# Patient Record
Sex: Female | Born: 1950 | Race: Black or African American | Hispanic: No | Marital: Married | Smoking: Former smoker
Health system: Southern US, Community
[De-identification: ages and names within clinical notes are randomized; demographics above are authoritative.]

## PROBLEM LIST (undated history)

## (undated) DIAGNOSIS — D649 Anemia, unspecified: Secondary | ICD-10-CM

## (undated) DIAGNOSIS — R55 Syncope and collapse: Secondary | ICD-10-CM

## (undated) DIAGNOSIS — I472 Ventricular tachycardia, unspecified: Secondary | ICD-10-CM

## (undated) DIAGNOSIS — E782 Mixed hyperlipidemia: Secondary | ICD-10-CM

## (undated) DIAGNOSIS — I428 Other cardiomyopathies: Secondary | ICD-10-CM

## (undated) DIAGNOSIS — I340 Nonrheumatic mitral (valve) insufficiency: Secondary | ICD-10-CM

## (undated) DIAGNOSIS — I10 Essential (primary) hypertension: Secondary | ICD-10-CM

## (undated) HISTORY — DX: Mixed hyperlipidemia: E78.2

## (undated) HISTORY — DX: Ventricular tachycardia, unspecified: I47.20

## (undated) HISTORY — DX: Nonrheumatic mitral (valve) insufficiency: I34.0

## (undated) HISTORY — DX: Other cardiomyopathies: I42.8

## (undated) HISTORY — DX: Anemia, unspecified: D64.9

## (undated) HISTORY — DX: Essential (primary) hypertension: I10

## (undated) HISTORY — PX: BILATERAL SALPINGOOPHORECTOMY: SHX1223

## (undated) HISTORY — DX: Ventricular tachycardia: I47.2

## (undated) HISTORY — DX: Syncope and collapse: R55

## (undated) HISTORY — PX: TOTAL ABDOMINAL HYSTERECTOMY: SHX209

---

## 2006-06-30 ENCOUNTER — Ambulatory Visit: Payer: Self-pay | Admitting: Cardiology

## 2006-06-30 ENCOUNTER — Inpatient Hospital Stay (HOSPITAL_COMMUNITY): Admission: AD | Admit: 2006-06-30 | Discharge: 2006-07-04 | Payer: Self-pay | Admitting: Cardiology

## 2006-07-21 ENCOUNTER — Ambulatory Visit: Payer: Self-pay

## 2006-07-25 ENCOUNTER — Ambulatory Visit: Payer: Self-pay | Admitting: Cardiology

## 2006-10-21 ENCOUNTER — Ambulatory Visit: Payer: Self-pay | Admitting: Internal Medicine

## 2007-02-04 ENCOUNTER — Ambulatory Visit: Payer: Self-pay | Admitting: Internal Medicine

## 2007-05-06 ENCOUNTER — Ambulatory Visit: Payer: Self-pay | Admitting: Internal Medicine

## 2007-05-21 ENCOUNTER — Encounter: Payer: Self-pay | Admitting: Internal Medicine

## 2007-06-01 ENCOUNTER — Encounter: Payer: Self-pay | Admitting: Physician Assistant

## 2007-06-01 ENCOUNTER — Ambulatory Visit: Payer: Self-pay | Admitting: Cardiology

## 2007-06-10 ENCOUNTER — Ambulatory Visit: Payer: Self-pay | Admitting: Cardiology

## 2007-08-06 ENCOUNTER — Ambulatory Visit: Payer: Self-pay | Admitting: Cardiology

## 2007-10-30 ENCOUNTER — Ambulatory Visit: Payer: Self-pay | Admitting: Internal Medicine

## 2008-01-29 ENCOUNTER — Ambulatory Visit: Payer: Self-pay | Admitting: Internal Medicine

## 2008-05-27 ENCOUNTER — Ambulatory Visit: Payer: Self-pay | Admitting: Internal Medicine

## 2008-09-09 ENCOUNTER — Ambulatory Visit: Payer: Self-pay | Admitting: Internal Medicine

## 2008-12-13 ENCOUNTER — Encounter: Payer: Self-pay | Admitting: Internal Medicine

## 2009-02-09 HISTORY — PX: CARDIAC DEFIBRILLATOR PLACEMENT: SHX171

## 2009-03-06 ENCOUNTER — Ambulatory Visit: Payer: Self-pay | Admitting: Cardiology

## 2009-03-07 ENCOUNTER — Encounter: Payer: Self-pay | Admitting: Internal Medicine

## 2009-03-07 ENCOUNTER — Inpatient Hospital Stay (HOSPITAL_COMMUNITY): Admission: EM | Admit: 2009-03-07 | Discharge: 2009-03-09 | Payer: Self-pay | Admitting: Emergency Medicine

## 2009-03-09 ENCOUNTER — Encounter: Payer: Self-pay | Admitting: Internal Medicine

## 2009-03-14 ENCOUNTER — Encounter (INDEPENDENT_AMBULATORY_CARE_PROVIDER_SITE_OTHER): Payer: Self-pay | Admitting: *Deleted

## 2009-03-14 ENCOUNTER — Telehealth (INDEPENDENT_AMBULATORY_CARE_PROVIDER_SITE_OTHER): Payer: Self-pay | Admitting: *Deleted

## 2009-03-21 ENCOUNTER — Encounter: Payer: Self-pay | Admitting: Internal Medicine

## 2009-03-23 ENCOUNTER — Ambulatory Visit: Payer: Self-pay

## 2009-03-23 ENCOUNTER — Telehealth (INDEPENDENT_AMBULATORY_CARE_PROVIDER_SITE_OTHER): Payer: Self-pay | Admitting: *Deleted

## 2009-03-29 DIAGNOSIS — R55 Syncope and collapse: Secondary | ICD-10-CM

## 2009-03-29 DIAGNOSIS — E785 Hyperlipidemia, unspecified: Secondary | ICD-10-CM

## 2009-03-29 DIAGNOSIS — Z95810 Presence of automatic (implantable) cardiac defibrillator: Secondary | ICD-10-CM | POA: Insufficient documentation

## 2009-04-05 DIAGNOSIS — I472 Ventricular tachycardia: Secondary | ICD-10-CM

## 2009-04-05 DIAGNOSIS — I421 Obstructive hypertrophic cardiomyopathy: Secondary | ICD-10-CM | POA: Insufficient documentation

## 2009-04-05 DIAGNOSIS — I10 Essential (primary) hypertension: Secondary | ICD-10-CM | POA: Insufficient documentation

## 2009-04-05 DIAGNOSIS — I080 Rheumatic disorders of both mitral and aortic valves: Secondary | ICD-10-CM

## 2009-04-05 DIAGNOSIS — D649 Anemia, unspecified: Secondary | ICD-10-CM

## 2009-07-07 ENCOUNTER — Ambulatory Visit: Payer: Self-pay | Admitting: Cardiology

## 2009-10-20 ENCOUNTER — Ambulatory Visit: Payer: Self-pay | Admitting: Cardiology

## 2010-02-02 ENCOUNTER — Ambulatory Visit: Payer: Self-pay | Admitting: Internal Medicine

## 2010-04-20 ENCOUNTER — Ambulatory Visit: Payer: Self-pay | Admitting: Cardiology

## 2010-05-10 ENCOUNTER — Encounter: Payer: Self-pay | Admitting: Physician Assistant

## 2010-05-10 ENCOUNTER — Telehealth (INDEPENDENT_AMBULATORY_CARE_PROVIDER_SITE_OTHER): Payer: Self-pay | Admitting: *Deleted

## 2010-08-03 ENCOUNTER — Ambulatory Visit: Payer: Self-pay | Admitting: Internal Medicine

## 2010-10-23 ENCOUNTER — Ambulatory Visit: Payer: Self-pay | Admitting: Internal Medicine

## 2010-10-31 ENCOUNTER — Ambulatory Visit: Payer: Self-pay | Admitting: Internal Medicine

## 2010-11-07 ENCOUNTER — Ambulatory Visit: Payer: Self-pay | Admitting: Cardiology

## 2010-11-13 IMAGING — CR DG CHEST 2V
2 series · 2 of 2 positions shown · non-contrast
Comparison: 07/04/2006

CLINICAL DATA: Defibrillator malfunction.

CHEST - 2 VIEW

[w chest pa]
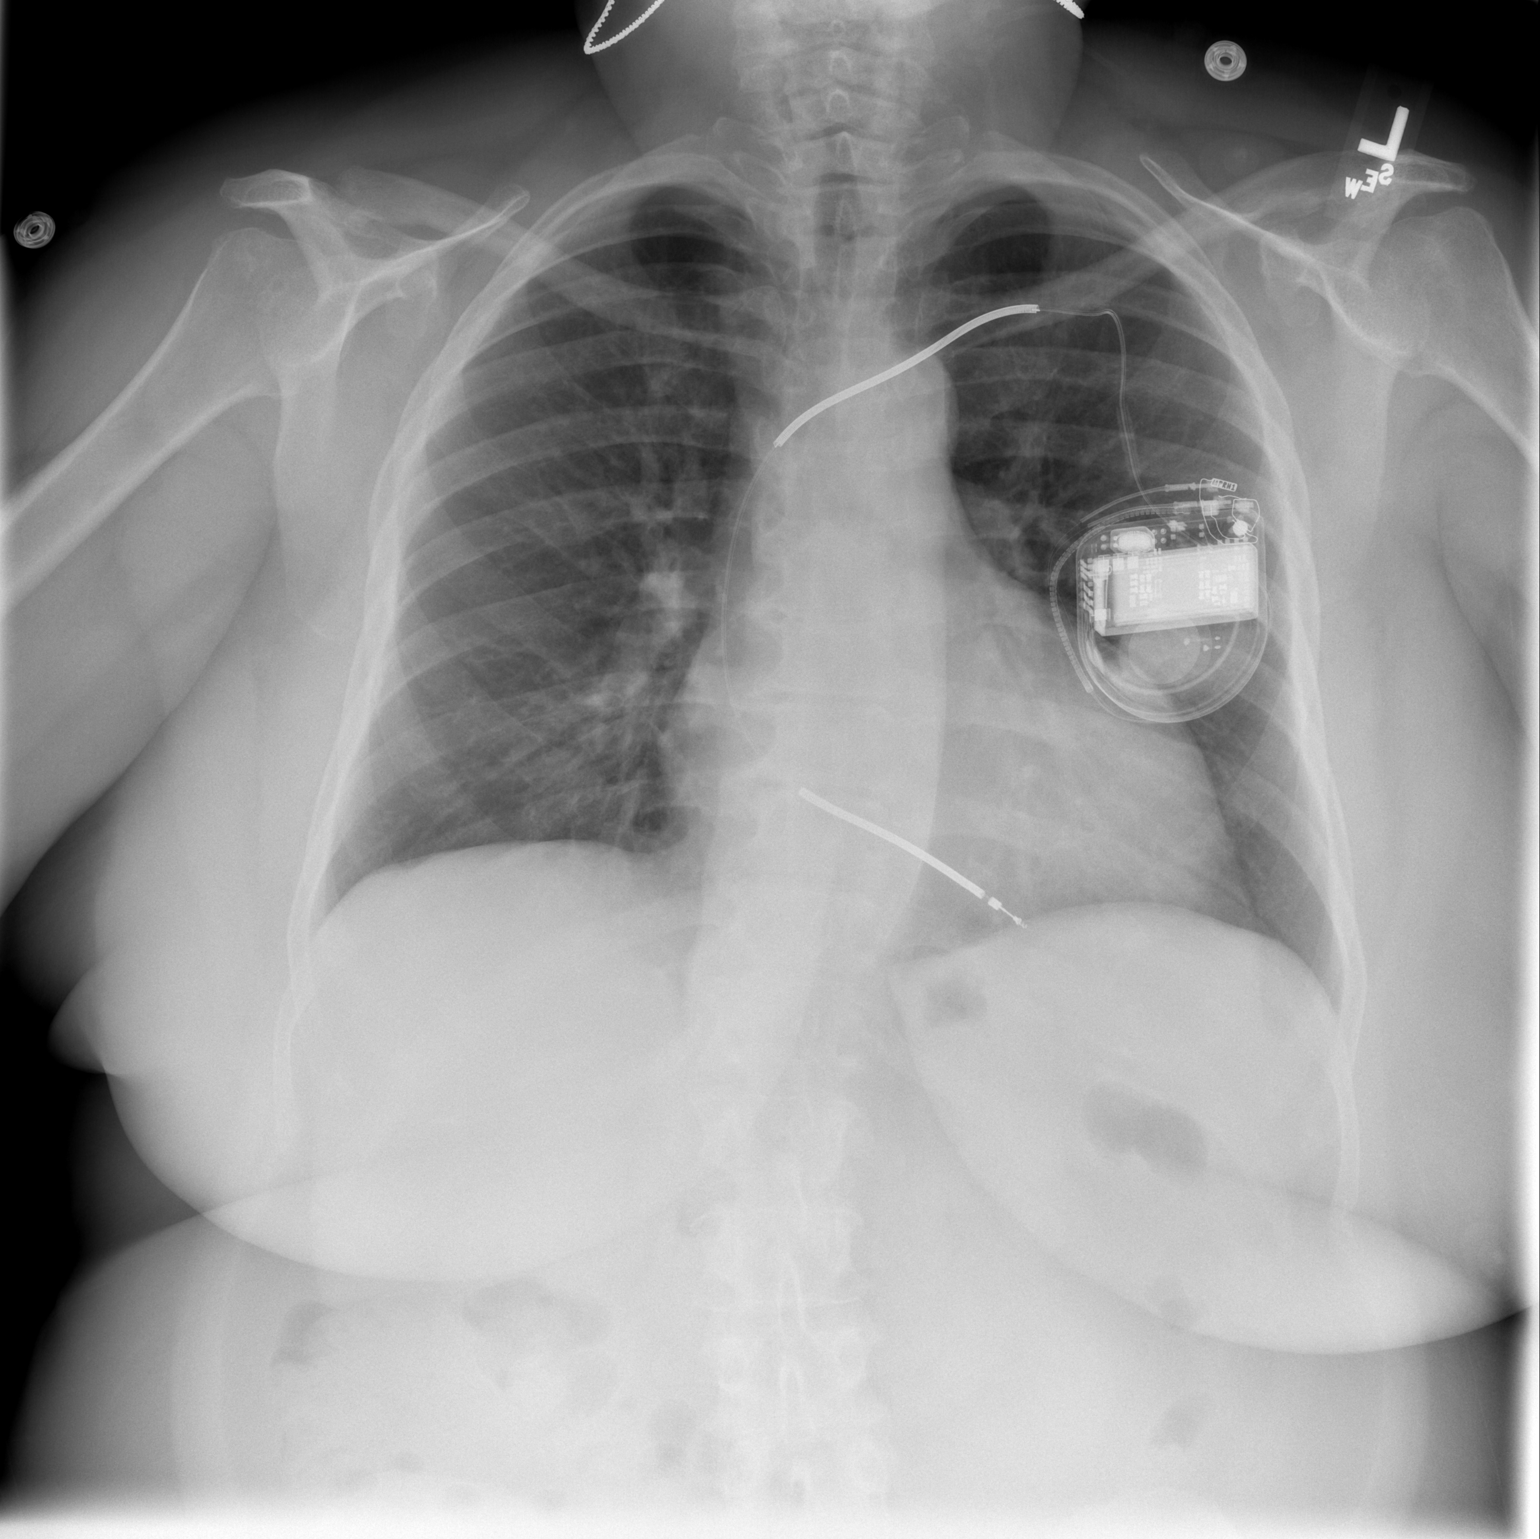

[w chest lat]
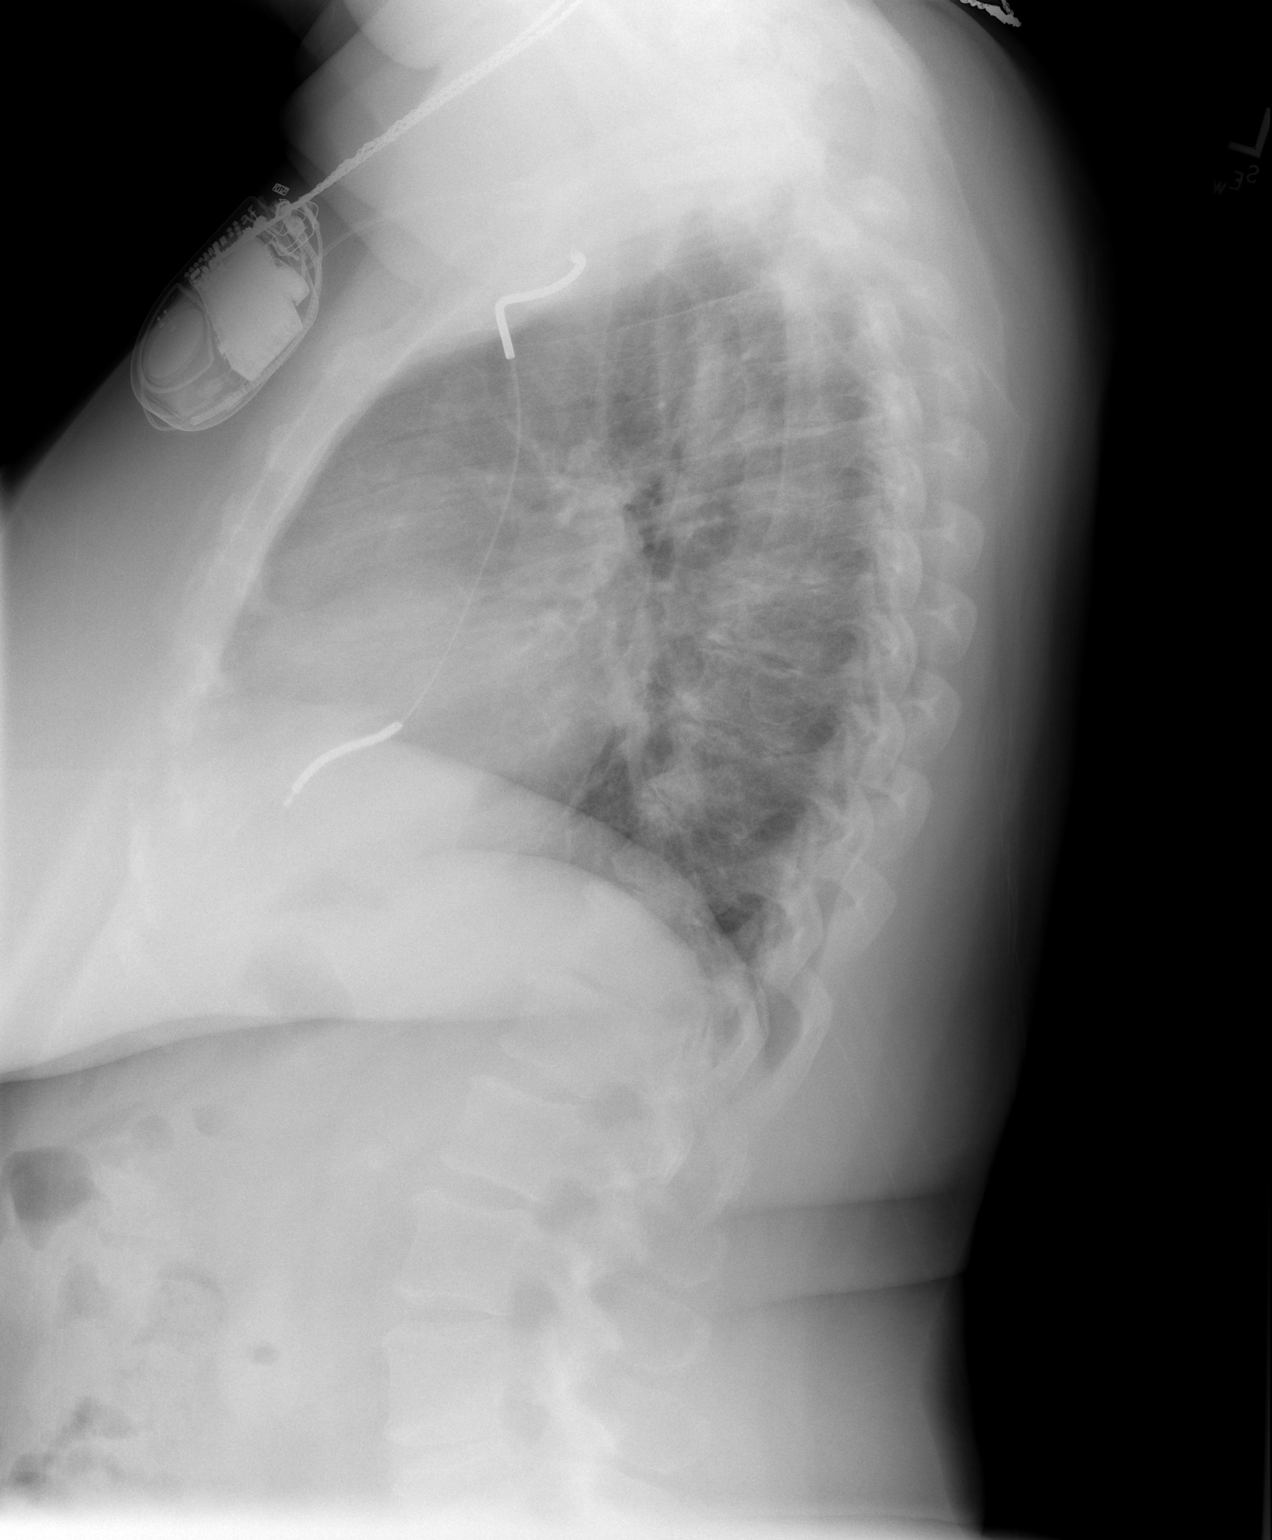

[2 of 2 positions shown; findings below may reference images not displayed]

FINDINGS: Left AICD remains in place, unchanged.  Visualized wires
appear intact.

There is mild cardiomegaly.  No focal airspace opacities or
effusions.  No acute bony abnormality.
IMPRESSION: No acute findings.

## 2010-11-15 IMAGING — CR DG CHEST 2V
2 series · 2 of 2 positions shown · non-contrast
Comparison: Chest radiograph 03/07/2009

CLINICAL DATA: Rewiring pacemaker

CHEST - 2 VIEW

[w chest pa]
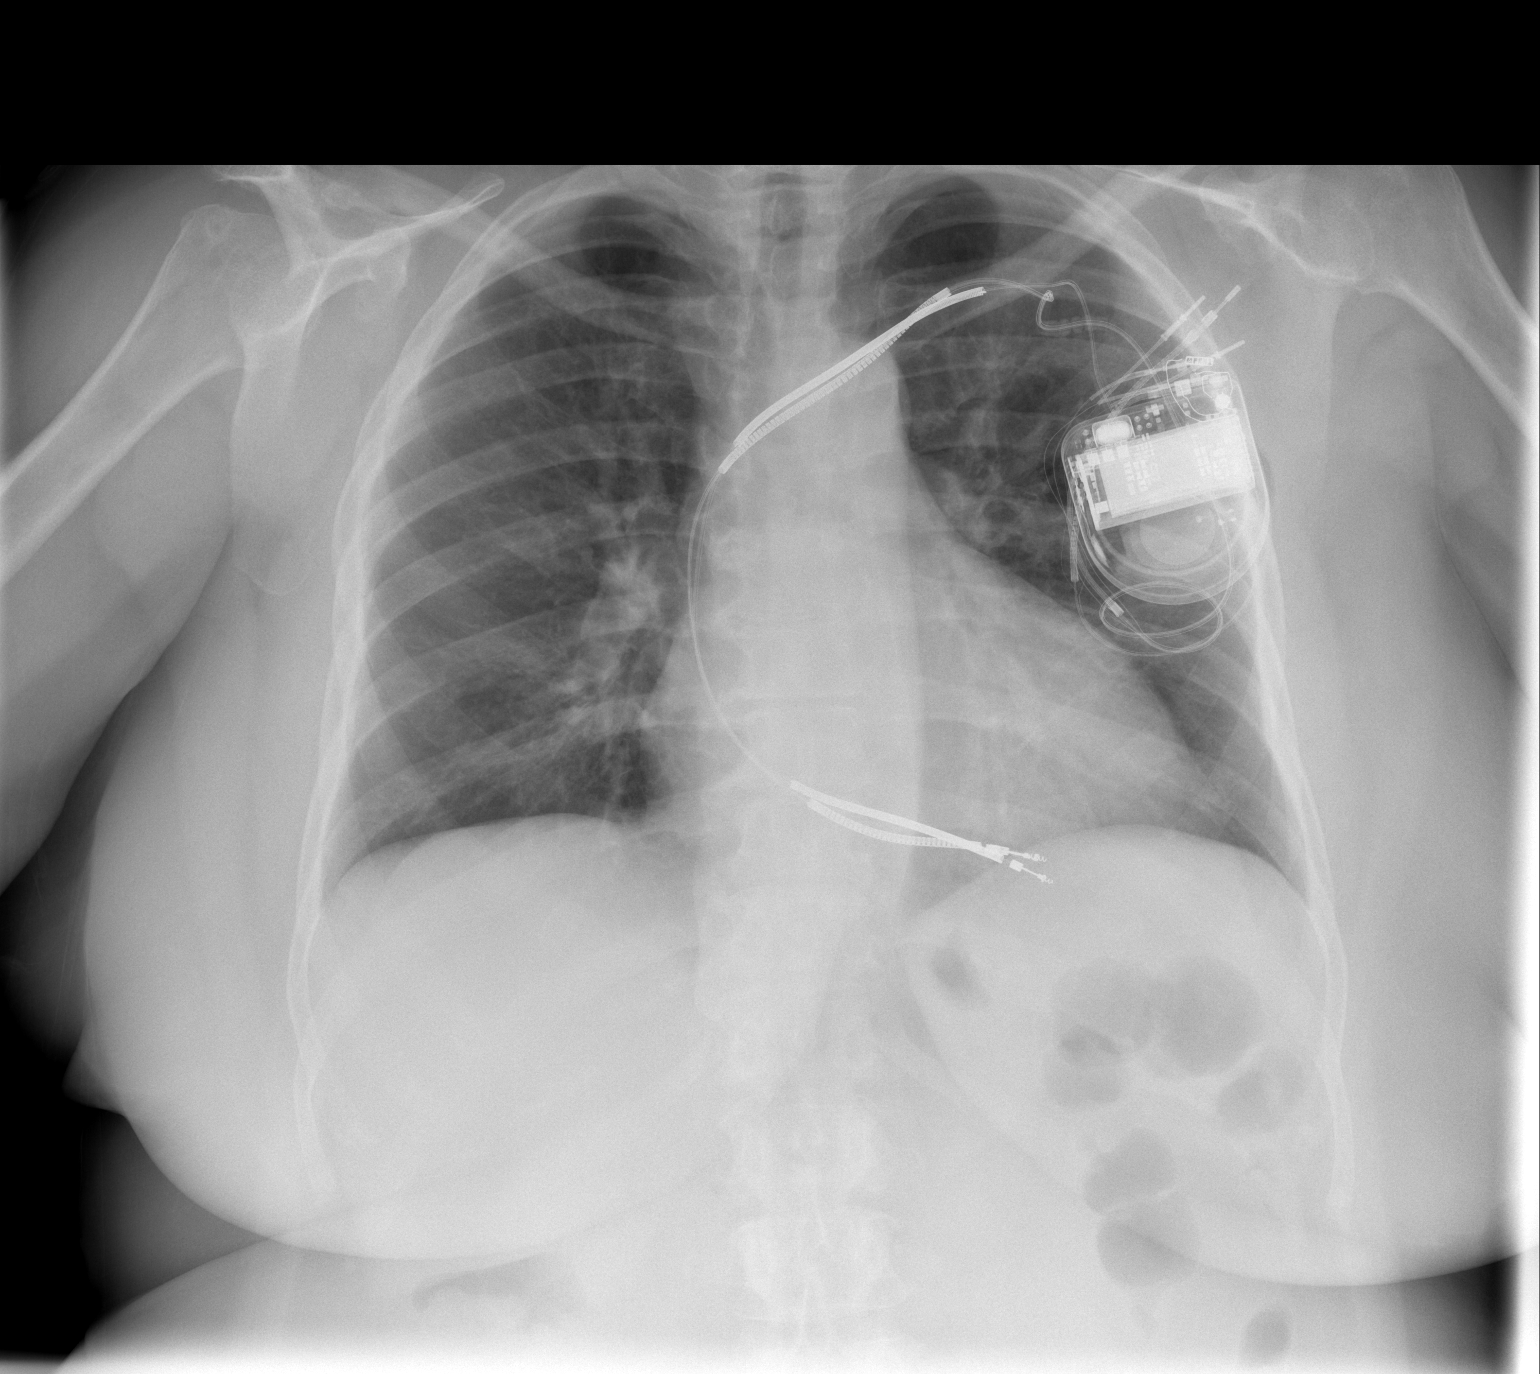

[w chest lat]
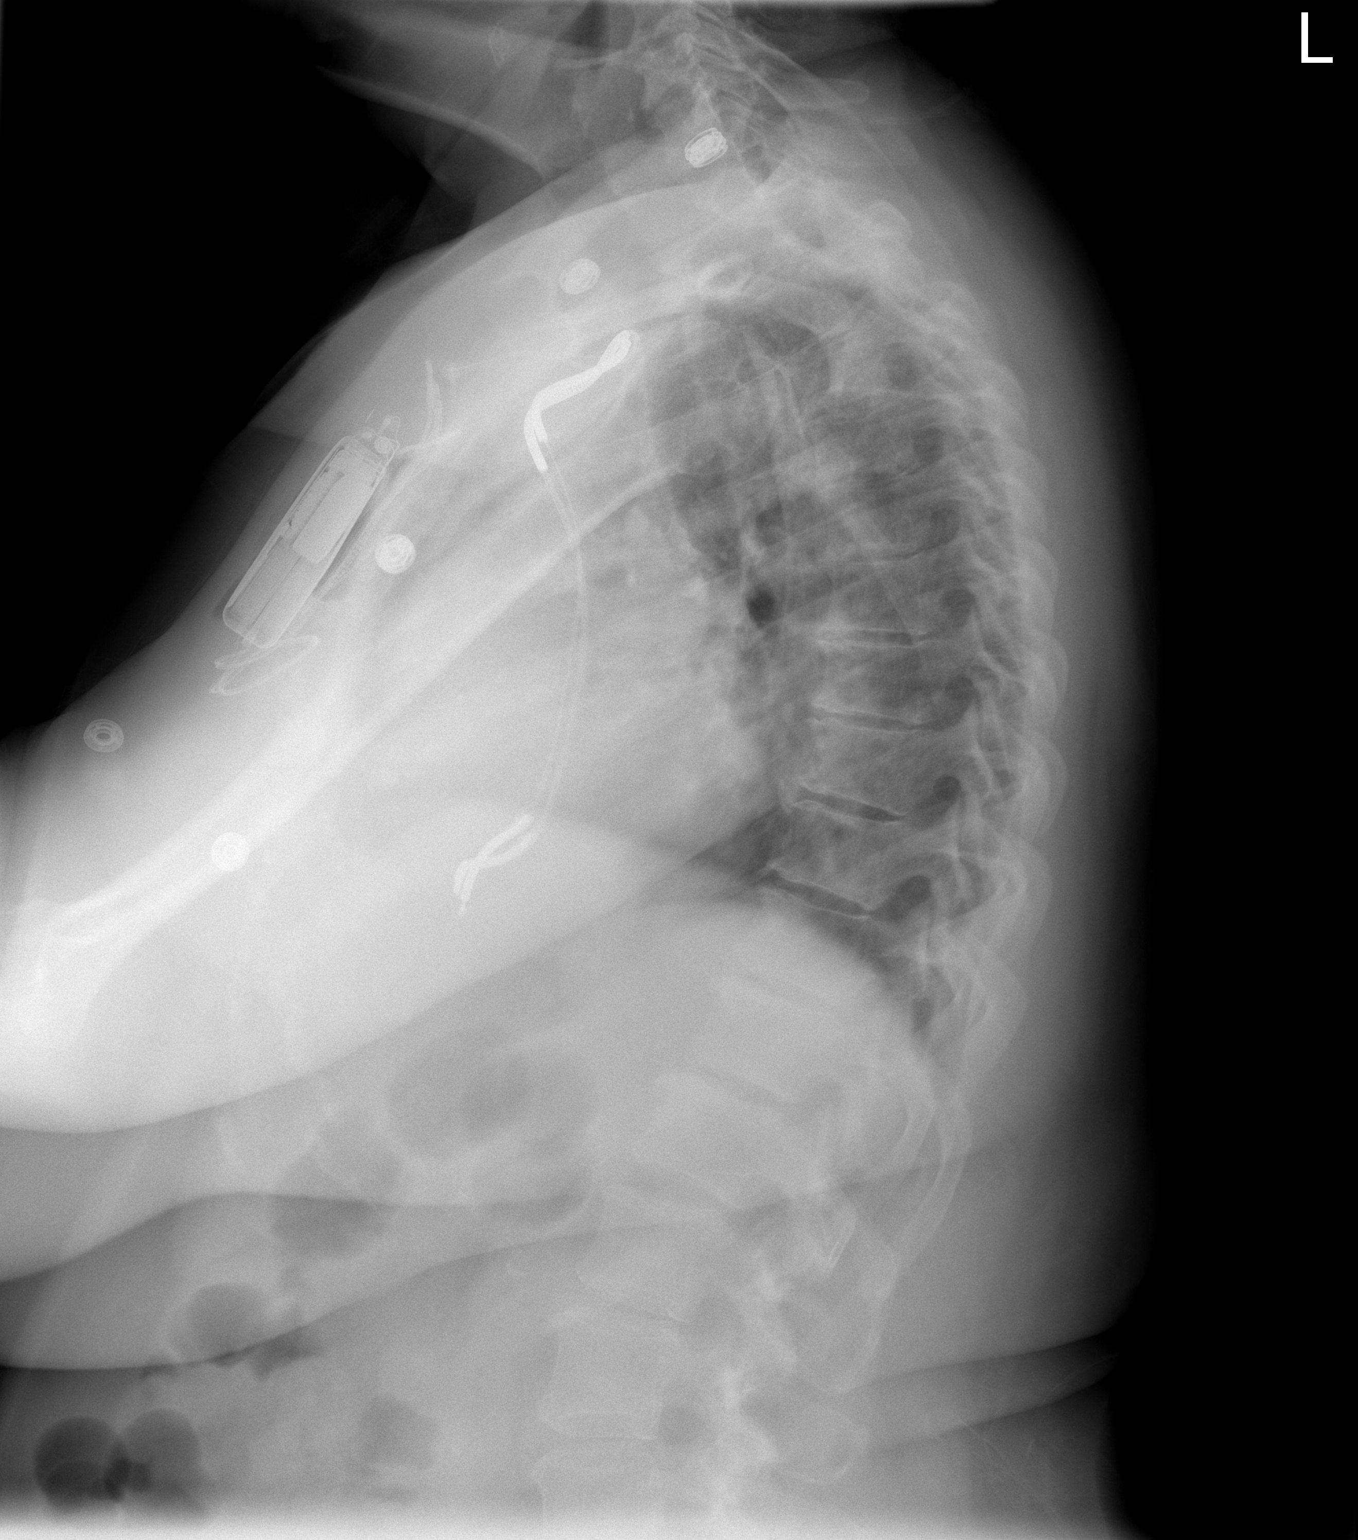

[2 of 2 positions shown; findings below may reference images not displayed]

FINDINGS: Left-sided AICD in place.  Interval placement of new
venous defibrillator wire with tip in the right heart.  No evidence
pneumothorax. Normal cardiac silhouette.
IMPRESSION: No evidence of complication following AICD rewiring.

## 2010-12-11 NOTE — Progress Notes (Signed)
Summary: AVAPRO CHANGED TO LOSARTAN DUE TO REQUESTED PA  ---- Converted from flag ---- ---- 05/10/2010 9:19 AM, Gypsy Balsam RN BSN wrote: Per Dr Johney Frame, change to Losartan 100mg  1 tablet daily.   ---- 05/04/2010 4:14 PM, Carlye Grippe wrote: Elvina Sidle, did you tell me that someone at the [facility] office would handle to Prior Auth for this patient's avapro? we got the request  again today. Can I send this back to [facility] office? ------------------------------  Phone Note Outgoing Call   Call placed by: Carlye Grippe,  May 10, 2010 3:19 PM Call placed to: Patient Summary of Call: left message on machine to call office.  Initial call taken by: Carlye Grippe,  May 10, 2010 3:20 PM  Follow-up for Phone Call        Patient informed of the above.  Follow-up by: Carlye Grippe,  May 10, 2010 4:57 PM    New/Updated Medications: LOSARTAN POTASSIUM 100 MG TABS (LOSARTAN POTASSIUM) Take 1 tablet by mouth once a day Prescriptions: LOSARTAN POTASSIUM 100 MG TABS (LOSARTAN POTASSIUM) Take 1 tablet by mouth once a day  #30 x 2   Entered by:   Carlye Grippe   Authorized by:   Hillis Range, MD   Signed by:   Carlye Grippe on 05/10/2010   Method used:   Electronically to        [facility]* (retail)       [address]       [place], [place]  [address]       Ph: [phone]       Fax: [phone]   RxID:   [id number]

## 2010-12-11 NOTE — Medication Information (Signed)
Summary: Environmental health practitioner [facility]  RX Folder/ [facility]   Imported By: Dorise Hiss 05/10/2010 16:54:38  _____________________________________________________________________  External Attachment:    Type:   Image     Comment:   External Document

## 2010-12-11 NOTE — Assessment & Plan Note (Signed)
Summary: PACER CHECK   Current Medications (verified): 1)  Losartan Potassium 100 Mg Tabs (Losartan Potassium) .... Take 1 Tablet By Mouth Once A Day 2)  Aspirin 81 Mg Tbec (Aspirin) .... One By Mouth Daily 3)  Lipitor 20 Mg Tabs (Atorvastatin Calcium) .... One By Mouth Daily 4)  Coreg 25 Mg Tabs (Carvedilol) .... One By Mouth Bid 5)  Klor-Con M20 20 Meq Cr-Tabs (Potassium Chloride Crys Cr) .... One By Mouth Daily 6)  Hydrochlorothiazide 25 Mg Tabs (Hydrochlorothiazide) .... One By Mouth Daily  Allergies (verified): No Known Drug Allergies    ICD Specifications Following MD:  Hillis Range, MD     ICD Vendor:  Medtronic     ICD Model Number:  7232     ICD Serial Number:  [id number] ICD DOI:  07/03/2006     ICD Implanting MD:  Sherryl Manges, MD  Lead 1:    Location: RV     DOI: 07/03/2006     Model #: 3086     Serial #: [id number]     Status: capped Lead 2:    Location: RV     DOI: /03/08/2009     Model #: [id number]     Serial #: [id number]     Status: active  Indications::  HOCM WITH SYNCOPE   ICD Follow Up Battery Voltage:  3.03 V     Charge Time:  8.25 seconds     Underlying rhythm:  SR ICD Dependent:  No       ICD Device Measurements Right Ventricle:  Amplitude: 10.4 mV, Impedance: 440 ohms, Threshold: 1.0 V at 0.2 msec Shock Impedance: 46/55 ohms   Episodes MS Episodes:  0     Coumadin:  No Shock:  0     ATP:  0     Nonsustained:  0     Atrial Therapies:  0 Ventricular Pacing:  <0.1%  Brady Parameters Mode VVI     Lower Rate Limit:  40      Tachy Zones VF:  200     VT:  250 (FVT VIA VF)     VT1:  171     Next Cardiology Appt Due:  11/19/2010 Tech Comments:  NORMAL DEVICE FUNCTION. NO EPISODES SINCE LAST CHECK. NO CHANGES MADE. ROV 11-19-09 W/JA. [name]  August 03, 2010 11:16 AM

## 2010-12-11 NOTE — Procedures (Signed)
Summary: defib check.[email or web address].[email or web address]   Current Medications (verified): 1)  Avapro 300 Mg Tabs (Irbesartan) .... One By Mouth Daily 2)  Aspirin 81 Mg Tbec (Aspirin) .... One By Mouth Daily 3)  Lipitor 20 Mg Tabs (Atorvastatin Calcium) .... One By Mouth Daily 4)  Coreg 25 Mg Tabs (Carvedilol) .... One By Mouth Bid 5)  Klor-Con M20 20 Meq Cr-Tabs (Potassium Chloride Crys Cr) .... One By Mouth Daily 6)  Hydrochlorothiazide 25 Mg Tabs (Hydrochlorothiazide) .... One By Mouth Daily  Allergies (verified): No Known Drug Allergies   ICD Specifications Following MD:  Hillis Range, MD     ICD Vendor:  Medtronic     ICD Model Number:  7232     ICD Serial Number:  [id number] H ICD DOI:  07/03/2006     ICD Implanting MD:  Sherryl Manges, MD  Lead 1:    Location: RV     DOI: 07/03/2006     Model #: 4098     Serial #: [id number]     Status: capped Lead 2:    Location: RV     DOI: /03/08/2009     Model #: 5621     Serial #: [id number]     Status: active  Indications::  HOCM WITH SYNCOPE   ICD Follow Up Remote Check?  No Battery Voltage:  3.07 V     Charge Time:  8.21 seconds     Underlying rhythm:  SR ICD Dependent:  No       ICD Device Measurements Right Ventricle:  Amplitude: 10.2 mV, Impedance: 448 ohms, Threshold: 1.0 V at 0.2 msec Shock Impedance: 45/49 ohms   Episodes Coumadin:  No Shock:  0     ATP:  0     Nonsustained:  0     Ventricular Pacing:  <0.1%  Brady Parameters Mode VVI     Lower Rate Limit:  40      Tachy Zones VF:  200     VT:  250 (FVT VIA VF)     VT1:  171     Next Cardiology Appt Due:  07/12/2010 Tech Comments:  No parameter changes.  Device function normal.  ROV 3 months [facility]. Altha Harm, LPN  April 20, 2010 10:13 AM

## 2010-12-11 NOTE — Procedures (Signed)
Summary: 3 MO FU W/Marley Pakula-SRS   Current Medications (verified): 1)  Avapro 300 Mg Tabs (Irbesartan) .... One By Mouth Daily 2)  Aspirin 81 Mg Tbec (Aspirin) .... One By Mouth Daily 3)  Lipitor 20 Mg Tabs (Atorvastatin Calcium) .... One By Mouth Daily 4)  Coreg 25 Mg Tabs (Carvedilol) .... One By Mouth Bid 5)  Klor-Con M20 20 Meq Cr-Tabs (Potassium Chloride Crys Cr) .... One By Mouth Daily 6)  Hydrochlorothiazide 25 Mg Tabs (Hydrochlorothiazide) .... One By Mouth Daily  Allergies (verified): No Known Drug Allergies   ICD Specifications Following MD:  Hillis Range, MD     ICD Vendor:  Medtronic     ICD Model Number:  7232     ICD Serial Number:  [id number] ICD DOI:  07/03/2006     ICD Implanting MD:  Sherryl Manges, MD  Lead 1:    Location: RV     DOI: 07/03/2006     Model #: 7829     Serial #: [id number]     Status: capped Lead 2:    Location: RV     DOI: /03/08/2009     Model #: 7846     Serial #: [id number]     Status: active  Indications::  HOCM WITH SYNCOPE   ICD Follow Up Remote Check?  No Battery Voltage:  3.06 V     Charge Time:  8.21 seconds     Underlying rhythm:  SR ICD Dependent:  No       ICD Device Measurements Right Ventricle:  Amplitude: 10.7 mV, Impedance: 472 ohms, Threshold: 1.0 V at 0.2 msec Shock Impedance: 44 ohms   Episodes Coumadin:  No Shock:  0     ATP:  0     Nonsustained:  0      Brady Parameters Mode VVI     Lower Rate Limit:  40      Tachy Zones VF:  200     VT:  250 (FVT VIA VF)     VT1:  171     Next Cardiology Appt Due:  04/11/2010 Tech Comments:  No parameter changes.  Device function normal.  ROV 3 months in the [place] clinic. Altha Harm, LPN  February 02, 2010 10:09 AM

## 2010-12-13 NOTE — Assessment & Plan Note (Signed)
Summary: [name] for 3 mths w/sk in [place]   Current Medications (verified): 1)  Aspirin 81 Mg Tbec (Aspirin) .... One By Mouth Daily 2)  Lipitor 20 Mg Tabs (Atorvastatin Calcium) .... One By Mouth Daily 3)  Coreg 25 Mg Tabs (Carvedilol) .... One By Mouth Bid 4)  Klor-Con M20 20 Meq Cr-Tabs (Potassium Chloride Crys Cr) .... One By Mouth Daily 5)  Hydrochlorothiazide 25 Mg Tabs (Hydrochlorothiazide) .... One By Mouth Daily  Allergies (verified): No Known Drug Allergies    ICD Specifications Following MD:  Sophia Range, MD     ICD Vendor:  Medtronic     ICD Model Number:  7232     ICD Serial Number:  [id number] H ICD DOI:  07/03/2006     ICD Implanting MD:  Sophia Manges, MD  Lead 1:    Location: RV     DOI: 07/03/2006     Model #: 1308     Serial #: [id number]     Status: capped Lead 2:    Location: RV     DOI: /03/08/2009     Model #: 9528     Serial #: [id number]     Status: active  Indications::  HOCM WITH SYNCOPE   ICD Follow Up Battery Voltage:  3.04 V     Charge Time:  8.25 seconds     Underlying rhythm:  SR ICD Dependent:  No       ICD Device Measurements Right Ventricle:  Amplitude: 11.2 mV, Impedance: 464 ohms, Threshold: 1.0 V at 0.3 msec Shock Impedance: 47/58 ohms   Episodes MS Episodes:  0     Coumadin:  No Shock:  0     ATP:  0     Nonsustained:  0     Atrial Therapies:  0 Ventricular Pacing:  <0.1%  Brady Parameters Mode VVI     Lower Rate Limit:  40      Tachy Zones VF:  200     VT:  250 (FVT VIA VF)     VT1:  171     Next Cardiology Appt Due:  01/30/2011 Tech Comments:  NORMAL DEVICE FUNCTION.  NO EPISODES SINCE LAST CHECK.  NO CHANGES MADE. DEMONSTRATED TONES FOR PT.  ROV 01-30-11 @ 1100 W/SK IN [place] [facility]. [name]  October 31, 2010 11:23 AM

## 2010-12-26 ENCOUNTER — Ambulatory Visit: Payer: Self-pay | Admitting: Cardiology

## 2011-01-23 ENCOUNTER — Ambulatory Visit: Payer: Self-pay | Admitting: Cardiology

## 2011-01-25 ENCOUNTER — Encounter: Payer: Self-pay | Admitting: Internal Medicine

## 2011-01-25 ENCOUNTER — Encounter (INDEPENDENT_AMBULATORY_CARE_PROVIDER_SITE_OTHER): Payer: PRIVATE HEALTH INSURANCE

## 2011-01-25 DIAGNOSIS — I428 Other cardiomyopathies: Secondary | ICD-10-CM

## 2011-01-29 NOTE — Assessment & Plan Note (Signed)
Summary: Medtronic device-for check -vs   Current Medications (verified): 1)  Aspirin 81 Mg Tbec (Aspirin) .... One By Mouth Daily 2)  Lipitor 20 Mg Tabs (Atorvastatin Calcium) .... One By Mouth Daily 3)  Coreg 25 Mg Tabs (Carvedilol) .... One By Mouth Bid 4)  Klor-Con M20 20 Meq Cr-Tabs (Potassium Chloride Crys Cr) .... One By Mouth Daily 5)  Hydrochlorothiazide 25 Mg Tabs (Hydrochlorothiazide) .... One By Mouth Daily 6)  Losartan Potassium 100 Mg Tabs (Losartan Potassium) .... Take One Tablet By Mouth Once Daily.  Allergies (verified): No Known Drug Allergies    ICD Specifications Following MD:  Hillis Range, MD     ICD Vendor:  Medtronic     ICD Model Number:  7232     ICD Serial Number:  [id number] H ICD DOI:  07/03/2006     ICD Implanting MD:  Sherryl Manges, MD  Lead 1:    Location: RV     DOI: 07/03/2006     Model #: 1308     Serial #: [id number]     Status: capped Lead 2:    Location: RV     DOI: /03/08/2009     Model #: 9528     Serial #: [id number]     Status: active  Indications::  HOCM WITH SYNCOPE   ICD Follow Up ICD Dependent:  No      Episodes Coumadin:  No  Brady Parameters Mode VVI     Lower Rate Limit:  40      Tachy Zones VF:  200     VT:  250 (FVT VIA VF)     VT1:  171     Tech Comments:  meds reviewed. see paceart report. [name]  January 25, 2011 3:31 PM

## 2011-01-31 ENCOUNTER — Encounter: Payer: Self-pay | Admitting: Internal Medicine

## 2011-02-20 LAB — BASIC METABOLIC PANEL
BUN: 12 mg/dL (ref 6–23)
Calcium: 9.3 mg/dL (ref 8.4–10.5)
GFR calc non Af Amer: 56 mL/min — ABNORMAL LOW (ref >60)
Potassium: 3.6 mEq/L (ref 3.5–5.1)
Sodium: 139 mEq/L (ref 135–145)

## 2011-02-20 LAB — DIFFERENTIAL
Eosinophils Relative: 1 % (ref 0–5)
Lymphocytes Relative: 25 % (ref 12–46)
Lymphs Abs: 2.5 10*3/uL (ref 0.7–4.0)
Neutro Abs: 6.8 10*3/uL (ref 1.7–7.7)
Neutrophils Relative %: 67 % (ref 43–77)

## 2011-02-20 LAB — PROTIME-INR
INR: 1.1 (ref 0.00–1.49)
Prothrombin Time: 14.5 seconds (ref 11.6–15.2)

## 2011-02-20 LAB — GLUCOSE, CAPILLARY: Glucose-Capillary: 129 mg/dL — ABNORMAL HIGH (ref 70–99)

## 2011-02-20 LAB — CBC
HCT: 30.9 % — ABNORMAL LOW (ref 36.0–46.0)
Platelets: 226 10*3/uL (ref 150–400)
WBC: 10.2 10*3/uL (ref 4.0–10.5)

## 2011-02-20 LAB — APTT: aPTT: 37 seconds (ref 24–37)

## 2011-03-26 NOTE — Cardiovascular Report (Signed)
[name]                   [facility]ICE [facility]INIC NOTE   [name], [name]                       MRN:          [id number]  DATE:08/06/2007                            DOB:          [birth year]    [name] was seen today in the [facility] for a followup of  her Medtronic Maximo ICD, model #4098 implanted on July 03, 2006, for  hypertrophic cardiomyopathy with syncope.   Interrogation of her device demonstrated,  R waves of 11.8 millivolts with an RV impedance of 568 ohms, and a  threshold of 1 volt at 0.1 milliseconds.  Her shock impedances were 46 and 51 ohms.  Her battery voltage was 3.14 volts with a charge time of 7.53 seconds.  She was in a normal sinus rhythm today and V paces less than 0.1% of the  time.  She has had no episodes of any arrhythmias since the last interrogation.  Her 69/49 lead was stable.  Her lead integrity alert was installed  today.   She will return to clinic in December with Dr. Graciela Husbands in the [place] office.      Gypsy Balsam, RN,BSN  Electronically Signed      Duke Salvia, MD, [facility]  Electronically Signed   AS/MedQ  DD: 08/06/2007  DT: 08/06/2007  Job #: [id number]

## 2011-03-26 NOTE — Discharge Summary (Signed)
NAME:  [name], [name]              ACCOUNT NO.:  [id number]   MEDICAL RECORD NO.:  [id number]          PATIENT TYPE:  INP   LOCATION:  3734                         FACILITY:  [facility]   PHYSICIAN:  Duke Salvia, MD, FACCDATE OF BIRTH:  1951/02/01   DATE OF ADMISSION:  03/06/2009  DATE OF DISCHARGE:  03/09/2009                               DISCHARGE SUMMARY   The patient has no known drug allergies.   TIME FOR THIS DICTATION AND EXAMINATION AND EXPLANATION TO THE PATIENT:  Greater than 40 minutes.   FINAL DIAGNOSES:  1. Discharging day 1, status post implant of a new right ventricular      cardioverter-defibrillator lead.  The patient has a Medtronic      Maximo VR single-chamber cardioverter-defibrillator.  2. Admitted with triggering of her implantable cardioverter-      defibrillator alarm.  3. Interrogation of a device shows pacing impedance greater than 3000      ohms.  Her [phone] Sprint Fidelis lead has a fracture.   SECONDARY DIAGNOSES:  1. Implantable cardioverter-defibrillator implant in August 2007.      a.     The patient had syncope/hypertrophic obstructive       cardiomyopathy/moderate-to-severe mitral regurgitation.      b.     Catheterization in August 2007.  Coronaries are free of       significant disease.      c.     Ejection fraction at catheterization 40% with 3+ mitral       regurgitation.  2. Moderate pulmonary hypertension.  3. Nonsustained ventricular tachycardia.  4. Hypertension.  5. Status post total abdominal hysterectomy/bilateral salpingo-      oophorectomy procedure on March 08, 2009.   A new right ventricular lead was implanted for her cardioverter-  defibrillator.  Her 6949 lead was capped.  The defibrillator threshold  study then carried out less than or equal to 20 J, Dr. Graciela Husbands.  The  patient has had no postprocedural complications.  No hematoma at the ICD  pocket.  Postop interrogation shows all values within normal limits.  Chest x-ray  shows that the lead is in appropriate position.  Of note,  the patient did need a venogram during the procedure.   BRIEF HISTORY:  Ms. Odden is a 59 year old female.  She has a history  of nonischemic cardiomyopathy.  She has a history of exercise-induced  syncope.  She had an ICD placement in August 2007.   The patient heard a beeping noise in her chest on April 25.  She had  another episode about 4 o'clock in the afternoon.  She called Dr.  Odessa Fleming office.  She was asked to come in to the office.  The device was  interrogated.  She was found to have a high lead impedance.  The patient  does have a single-chamber cardioverter-defibrillator.  The impedance  was greater than 3000 ohms.  The ICD detection was disabled.  The  patient was admitted through the emergency room to [facility]  for replacement of her defective ICD lead.   HOSPITAL COURSE:  The patient presents  through the emergency room with  alarm trigger.  She has never had a inappropriate shock.  She was seen  in consultation by Dr. Sherryl Manges.  He recommended a revision of the  lead.  This was done on March 08, 2009, after left venogram obtained.  The patient tolerated the procedure well.   Of note, the patient had 2-D echocardiogram on March 07, 2009, ejection  fraction of 55%, wall motion normal.  No regional wall motion  abnormalities, mild mitral regurgitation, no regurgitation of the  tricuspid valve, no pericardial effusion.  The patient has had a normal  chest x-ray after lead implantation.  Both leads are in appropriate  position.  The device has been interrogated.  The patient discharging  postprocedure day #1.  She is asked to keep her incision dry for the  next 7 days to sponge bathe until Wednesday, Mar 15, 2009.   She goes home with Keflex 500 mg 1 tab 3 times daily for the next 5  days.   MEDICATIONS AT DISCHARGE:  1. Avapro 300 mg daily.  2. Enteric-coated aspirin 81 mg daily.  3. Lipitor 20  mg daily at bedtime.  4. Coreg 25 mg 1 tab twice daily.  5. Potassium chloride 20 mEq daily.  6. Hydrochlorothiazide 25 mg daily.   She follows up at [facility] [address] at the [facility] on Thursday, Mar 23, 2009, at 10 o'clock.   LABORATORY STUDIES THIS ADMISSION:  Basic metabolic panel:  Sodium 139,  potassium 3.6, chloride 105, carbonate 23, glucose 123, BUN is 12,  creatinine 1.01.  Complete blood count:  White cells 10.2, hemoglobin  10.4, hematocrit 30.9, and platelets are 226.  Protime is 14.5, INR is  1.1, and PTT is 37.      Maple Mirza, [place]      Duke Salvia, MD, [facility]  Electronically Signed    GM/MEDQ  D:  03/09/2009  T:  03/09/2009  Job:  [id number]   cc:   Greer Pickerel, MD,FACC

## 2011-03-26 NOTE — H&P (Signed)
Sophia, Thomas              ACCOUNT NO.:  [id number]   MEDICAL RECORD NO.:  [id number]          PATIENT TYPE:  INP   LOCATION:  2108                         FACILITY:  [facility]   PHYSICIAN:  Darryl D. Prime, MD    DATE OF BIRTH:  09/16/1951   DATE OF ADMISSION:  03/06/2009  DATE OF DISCHARGE:                              HISTORY & PHYSICAL   CODE STATUS:  Full code.   PRIMARY CARE PHYSICIAN:  Linward Foster, MD in [place].   ELECTROPHYSIOLOGIST:  Duke Salvia, MD, [facility].   CARDIOLOGIST:  Learta Codding, MD,FACC   CHIEF COMPLAINT:  Beeping in chest.   HISTORY OF PRESENT ILLNESS:  [name] is a 60 year old female with a  history of nonischemic cardiomyopathy, history of exercise induced  syncope, status post ICD placement in August 2007, who heard a beeping  noise in her chest on the day prior to admission.  She thought it was a  large vehicle backing up initially and kept looking back during the  course of that morning and was not sure what it was. She then had  another episode of it about 4 p.m. and realized that it was in her  chest, contacted Dr. Odessa Fleming office who noted that she tried to  upload/interrogate her device remotely.  She was unable to do this and  so she came in.  In the emergency room, she had the device interrogated  by Medtronic.  She was found to have a lead impedance of the pacing  lead.  She has a single lead device for defibrillation.  It was greater  than 3000 ohms as of March 05, 2009.  The ICD detection for ventricular  tachycardia/ventricular fibrillation was taken off.  The patient denies  any chest pain, shortness of breath, paroxysmal nocturnal dyspnea,  orthopnea or paroxysmal nocturnal dyspnea.  She denies any  lightheadedness, no fever or cough.   PAST MEDICAL HISTORY:  1. History of hyperlipidemia  2. Hypertension.  3. Anemia.  4. Status post total abdominal hysterectomy and bilateral salpingo-      oophorectomy.  5. Nonischemic  cardiomyopathy as above.  6. August 2008 she had a right and left heart catheterization which      showed pulmonary hypertension, normal coronary arteries with      ejection fraction of 40% on LV gram.   MEDICATIONS:  1. Aspirin 81 mg daily.  2. Coreg 25 mg twice a day.  3. Avapro 300 mg daily.  4. Hydrochlorothiazide 25 mg daily.  5. Lipitor 20 mg daily.  6. Potassium chloride 20 mEq daily.   SOCIAL HISTORY:  She is married, lives with her husband.  She has since  discontinued tobacco for more than 20 years now.  No alcohol or illicit  drug use.   FAMILY HISTORY:  Positive for congestive heart failure in the mother who  passed away due to complications of this in her 41s.  She has a sister  who is status post heart transplant at age 87.   REVIEW OF SYSTEMS:  A 14-point review of systems was negative unless  stated above.  PHYSICAL EXAMINATION:  VITAL SIGNS: Temperature 98.7 with a pulse of 75,  respiratory rate 14, blood pressure 151/75.  Saturations 98% in room  air.  GENERAL: The patient is an obese female, sitting upright, in no acute  distress.  HEENT:  Normocephalic, atraumatic.  Pupils are equal, round and reactive  to light. Extraocular movements intact.  Oropharynx moist.  NECK:  Supple without lymphadenopathy or thyromegaly.  No carotid  bruits.  CHEST:  The scar is clean, dry and intact across the ICD with no signs  of swelling or erythema.  LUNGS: Clear to auscultation bilaterally.  CARDIOVASCULAR:  Regular rate and rhythm with no murmurs.  ABDOMEN:  Soft, obese, nontender.  Nondistended.  No hepatosplenomegaly.  Normoactive bowel sounds.  EXTREMITIES: No clubbing, cyanosis, or edema.  NEUROLOGIC:  She is alert and oriented x4 with cranial nerves II through  XII grossly intact.  Strength and sensation grossly intact.   Chest x-ray is pending to evaluate current placement of the lead.  EKG  showed sinus rhythm with a rate of 75 beats per minute with normal  axis,  PR interval 224, QRS 86, QT corrected within normal limits.  She had a  first degree AV block. No major change from EKG on July 04, 2006.   Other labs are pending.   ASSESSMENT AND PLAN:  This is a patient with a history of nonischemic  cardiomyopathy with an automatic implantable cardioverter defibrillator  placed for primary prophylaxis.  She has never had to use the device and  she has now had an apparently lead fracture with significant change in  impedance of that lead.  She, at this time, will be admitted to  telemetry with the device monitoring for ventricular tachycardia and  ventricular fibrillation turned off.  She will be held n.p.o.  Deep vein  thrombosis and gastrointestinal prophylaxis will be ordered.      Darryl D. Prime, MD  Electronically Signed     DDP/MEDQ  D:  03/07/2009  T:  03/07/2009  Job:  [id number]

## 2011-03-26 NOTE — Assessment & Plan Note (Signed)
[facility]                         ELECTROPHYSIOLOGY OFFICE NOTE   Sophia, Thomas                       MRN:          [id number]  DATE:05/06/2007                            DOB:          [birth year]    [name] is seen.  She has a history of syncope in the setting of  hypertrophic cardiomyopathy, moderate shortness of breath and moderate  to severe mitral regurgitation.  She is doing quite well at this point  from the arrhythmia point of view.  She has had no intercurrent syncope.  She has had no ICD discharges.   She does have a 6947 lead in.   She is to see Dr. Andee Lineman next month with reassessment of her mitral  regurgitation.   On examination, her blood pressure is 117/74, her pulse is 61.  Lungs  were clear.  Heart sounds were regular.  The extremities were without  edema.   MEDICATIONS INCLUDE:  1. Lipitor 20.  2. Hydrochlorothiazide 25.  3. Potassium 20.  4. Aspirin.  5. Coreg 25 b.i.d.  6. Lisinopril 10.  7. Avapro 300.   Interrogation of her Medtronic Maximo ICD demonstrates an R-wave of 11.7  with impedance of 576, a threshold of 0.5 at 0.3.  Impedances were  43/51, battery voltage is 3.17 and no intercurrent episodes.  Her device  was reprogrammed.   IMPRESSION:  1. Hypertrophic cardiomyopathy.  2. Syncope.  3. Status post ICD for the above.  4. [id number] lead with reprogramming and review because of fractures.  5. Mitral regurgitation - moderate to severe.   [name] is stable.  We will see her again in three months' time in  the clinic in [place] and establish [facility] followup at that time and I  will see her in one year.     Sophia Salvia, MD, [facility]  Electronically Signed    SCK/MedQ  DD: 05/06/2007  DT: 05/06/2007  Job #: [id number]   cc:   [name]' Judie Petit Royal, PA  [place], [place]

## 2011-03-26 NOTE — Assessment & Plan Note (Signed)
[facility]                          [facility]   [name], [name]                       MRN:          [id number]  DATE:06/01/2007                            DOB:          12-09-1950    PRIMARY CARDIOLOGIST:  Learta Codding, MD,FACC.   PRIMARY ELECTROPHYSIOLOGIST:  Duke Salvia, MD, [facility]   REASON FOR VISIT:  Scheduled clinic follow up.   HISTORY OF PRESENT ILLNESS:  The patient presents for a routine follow  up after last seen here in the clinic by Dr. Andee Lineman in September 2007  for continued management of nonischemic cardiomyopathy and known mitral  regurgitation.  Regarding the latter, this was felt to be moderately  severe by last echocardiogram in August 2007 and recommendation was to  repeat a 2D echo in approximately one month for close follow up.  However, this was never arranged.   The patient also has history of exercise-induced syncope and, given her  increased risk for SCD, underwent successful placement of a Medtronic  single chamber ICD by Dr. Sherryl Manges in August 2007.  She was just  recently seen here in the [facility] pacer clinic by Dr. Graciela Husbands, June 25, and  interrogation revealed no ICD discharges.  Clinically, the patient has  had no interim recurrent syncope.   With respect to heart failure symptoms, the patient reports only some  minimal exertional dyspnea upon completing a flight of stairs, and is  otherwise stating that she is doing extremely well.  Again, she reports  no firing of her ICD and is tolerating her medication regimen with no  adverse effects.   Electrocardiogram today reveals NSR at 60 BPM with borderline first  degree AV block, no ischemic changes.   CURRENT MEDICATIONS:  1. Coreg 25 b.i.d.  2. Aspirin 81 daily.  3. Lipitor 20 daily.  4. Avapro 300 daily.  5. Fish oil.  6. Hydrochlorothiazide 25 mg daily.  7. Lisinopril 10 mg daily.  8. Potassium 20 mg daily.   PHYSICAL EXAMINATION:   VITAL SIGNS:  Blood pressure 122/78, pulse 67 and  regular, weight 187.4.  GENERAL:  A 60 year old female, obese, sitting upright in no distress.  HEENT:  Normocephalic, atraumatic.  NECK:  Palpable, bilateral carotid pulses without bruits.  No JVD at 90  degrees.  LUNGS:  Clear to auscultation in all fields.  HEART:  Regular rate and rhythm.  S1, S2.  No significant murmurs.  ABDOMEN:  Benign.  EXTREMITIES:  No pedal edema.  NEUROLOGICAL:  No focal deficits.   IMPRESSION:  1. Hypertrophic cardiomyopathy.  2. History of exercise-induced syncope.      a.     Status post single chamber ICD implantation, August 2007.  3. Mitral regurgitation.      a.     Moderately severe by 2D echocardiogram, August 2007.  4. Dyslipidemia.  5. History of tobacco.   PLAN:  1. Schedule a 2D echo for reassessment of left ventricular function.      Consideration may need to be given to a follow up TEE for more  definitive evaluation.  2. Schedule return clinic follow up with myself and Dr. Andee Lineman in six      months or sooner, pending review of the echo results.     Gene Serpe, PA-C  Electronically Signed    GS/MedQ  DD: 06/01/2007  DT: 06/02/2007  Job #: [id number]

## 2011-03-26 NOTE — Op Note (Signed)
NAME:  [name], [name]              ACCOUNT NO.:  [id number]   MEDICAL RECORD NO.:  [id number]          PATIENT TYPE:  INP   LOCATION:  3734                         FACILITY:  [facility]   PHYSICIAN:  Duke Salvia, MD, FACCDATE OF BIRTH:  [birth year]   DATE OF PROCEDURE:  03/08/2009  DATE OF DISCHARGE:  03/09/2009                               OPERATIVE REPORT   PREOPERATIVE DIAGNOSIS:  Previously implanted implantable cardioverter-  defibrillator with fracture of 6949 lead.   POSTOPERATIVE DIAGNOSIS:  Previously implanted implantable cardioverter-  defibrillator with fracture of 6949 lead.   PROCEDURE:  Insertion of a new defibrillator lead with intraoperative  defibrillation threshold testing.   Following obtaining informed consent, the patient was brought to the  Electrophysiology Laboratory and placed on the fluoroscopic table in the  supine position.  After routine prep and drape, lidocaine was  infiltrated over the line of the previous incision and carried down to  the layer of device pocket with sharp dissection with electrocautery.  Venogram had been obtained which demonstrated the patency of the  extrathoracic left subclavian vein.  This having been done, access was  obtained and a St. Jude Durata 7120 lead, model [id number], was inserted  to the right ventricular apex where the bipolar R-wave was 92, the pace  impedance 826, and threshold 0.7 at 0.5, current threshold 0.7 MA.  There was no diaphragmatic pacing at 10 volts and current of injury was  brisk.  The lead was secured to the prepectoral fascia and then attached  to a Medtronic 7232Cx ICD through which the R-wave was 9.7 with  impedance of 672 and threshold 1 volt at 0.4, proximal coil impedance  was 46 and distal coil impedance was 50.   Ventricular fibrillation was then induced via T-wave shock for  defibrillation threshold testing.  After a total duration of 9 seconds,  a 20-joule shock was delivered through  measured resistance of 45 ohms  terminating ventricular fibrillation and restoring sinus rhythm.  The  [phone] lead was capped in its entirety.  The new leads and pulse generator  were placed in the pocket and secured to the prepectoral fascia.  The  wound was closed in 3 layers in a normal fashion.  Wound was washed and  dried and a benzoin and Steri-Strip dressing was applied.  Needle  counts, sponge counts, and instrument counts were correct at the end of  the procedure according to the staff.  The patient tolerated the  procedure without apparent complication.      Duke Salvia, MD, [facility]  Electronically Signed     SCK/MEDQ  D:  04/27/2009  T:  04/27/2009  Job:  [id number]

## 2011-03-27 ENCOUNTER — Ambulatory Visit: Payer: Self-pay | Admitting: Cardiology

## 2011-03-29 NOTE — Cardiovascular Report (Signed)
[name], [name]               ACCOUNT NO.:  [id number]   MEDICAL RECORD NO.:  [id number]          PATIENT TYPE:  INP   LOCATION:  2024                         FACILITY:  [facility]   PHYSICIAN:  Everardo Beals. Juanda Chance, MD,FACCDATE OF BIRTH:  05-Nov-1951   DATE OF PROCEDURE:  07/01/2006  DATE OF DISCHARGE:                              CARDIAC CATHETERIZATION   PROCEDURE:  Right and left heart cardiac catheterization and selective  coronary angiography report.   CLINICAL HISTORY:  [name] is 89 years and has known nonischemic  cardiomyopathy. She has had a previous catheterization in [place]. She  recently had an episode of dyspnea followed by frank syncopal episode where  she was out for several seconds. She was admitted to the hospital and seen  in consultation by Dr. Andee Lineman. Her troponins were positive, but her CKs were  negative. She had echocardiogram done there which showed an ejection  fraction of about 45% and moderately severe mitral regurgitation. She was  referred here for catheterization and possible EP study. She had a CT scan  performed in [place] which was negative for pulmonary embolism.   PROCEDURE:  Right heart catheterization was performed percutaneously through  via the right femoral artery using a ___________ sheath and Swan-Ganz  thermodilution catheter.  Left heart catheterization was performed  percutaneously via the right femoral artery using arterial sheath and 6-  [place] preformed coronary catheter.  A femoral arterial puncture was  performed, and Omnipaque was used. The patient tolerated the procedure well  and left the laboratory in satisfactory condition.   RESULTS:  Left main coronary artery:  The left main coronary artery was free  of significant disease.   Left anterior descending artery:  The left anterior descending artery gave  rise to diagonal branch and a septal perforator. These and the LAD were free  of significant disease.   Left circumflex  artery:  The left circumflex artery gave rise to a small  ramus branch and marginal branch and a posterolateral branch. These vessels  were free of significant disease.   Right coronary artery:  The right coronary artery was a moderate sized  vessel that gave rise to two right ventricular branches, a small posterior  descending branch and a large and small posterolateral branch. These vessels  were free of significant disease.   Left ventriculogram:  Left ventriculogram performed in the RAO projection  showed mild to moderate global hypokinesis with estimated ejection fraction  of 40%. There was moderately severe 3+ mitral regurgitation. Interpretation  of the LV function and mitral regurgitation was compromised by ventricular  arrhythmias at the time of angiography.   HEMODYNAMIC DATA:  The right arterial pressure was 3 mean. The pulmonary  artery pressure was 56/21 with a mean of 34. Pulmonary wedge pressure was 10  mean. Left ventricular pressure was 177/8. The aortic pressure was 177/94  with a mean of 123. The cardiac output/cardiac index was 3.3/1.9 by  thermodilution.   CONCLUSION:  1. Normal coronary angiography.  2. Moderate left ventricular dysfunction with an estimated ejection      fraction of 40%.  3. Moderately severe mitral regurgitation.  4. Moderate pulmonary hypertension.   RECOMMENDATIONS:  The patient has a nonischemic cardiomyopathy and mildly  severe mitral regurgitation. She also has some pulmonary hypertension. There  is no clear etiology for this, and she does not have a pulmonary embolus and  has no history of smoking or pulmonary disease. Dr. Andee Lineman has previously  discussed with Dr. Graciela Husbands further EP evaluation including possible signal  average ECG T wave alternans testing and EP study. We have asked Dr. Graciela Husbands  to see the patient in consultation.           ______________________________  Everardo Beals Juanda Chance, MD,FACC     BRB/MEDQ  D:  07/01/2006  T:   07/01/2006  Job:  [id number]   cc:   Learta Codding, MD,FACC  Lia Hopping  Duke Salvia, MD,FACC

## 2011-03-29 NOTE — Assessment & Plan Note (Signed)
[facility]                         ELECTROPHYSIOLOGY OFFICE NOTE   LAVIDA, PATCH                        MRN:          [id number]  DATE:10/21/2006                            DOB:          Oct 28, 1951    Ms. Dicenso is seeing following ICD implantation for hypertrophic heart  disease, syncope. She also has severe mitral regurgitation. She has been  doing better. Her blood pressure is somewhat elevated at 154/90, lungs  were clear, heart sounds were regular, the extremities were without  edema.   Interrogation of her Medtronic Maximo defibrillator demonstrated a R  wave of 14.6 with impedance of 600, a threshold of 1 volt at 0.4,  battery voltage was 3.19. The device was reprogrammed.   IMPRESSION:  1. Hypertrophic/dilated cardiomyopathy.  2. Syncope associated with #1.  3. Status post ICD for the above.  4. Hypertension.  5. [id number] LEAD ON RECALL.   Ms. Artist is stable. We have reprogrammed her device. Will plan to see  her in the clinic in 3 months and then will set her up for [facility] at  that time.     Duke Salvia, MD, [facility]  Electronically Signed    SCK/MedQ  DD: 10/21/2006  DT: 10/21/2006  Job #: [id number]

## 2011-03-29 NOTE — Discharge Summary (Signed)
[name], [name] NO.:  [id number]   MEDICAL RECORD NO.:  [id number]          PATIENT TYPE:  INP   LOCATION:  2024                         FACILITY:  [facility]   PHYSICIAN:  Maple Mirza, PA   DATE OF BIRTH:  04/24/1951   DATE OF ADMISSION:  06/30/2006  DATE OF DISCHARGE:  07/04/2006                                 DISCHARGE SUMMARY   ALLERGIES:  The patient has no known drug allergies.   PRINCIPAL DIAGNOSES:  1. Discharging day one status post implantation of Medtronic MAXIMO single-      chamber cardioverter defibrillator.  2. Syncope.  3. Left heart catheterization July 01, 2006, normal coronary anatomy, 3+      mitral regurgitation, ejection fraction 40%.  4. Nonischemic cardiomyopathy.      a.     Class I-II  congestive heart failure.   SECONDARY DIAGNOSES:  1. Hypertension.  2. Pulmonary hypertension.  3. Dyslipidemia.  4. Echocardiogram taken at [facility].   PROCEDURES:  1. Left and right heart catheterization July 01, 2006.  Left main free      of significant disease.  The LAD, the diagonal and septal perforators      are also free of significant disease.  Left circumflex had a small      ramus branch, one obtuse marginal, one posterolateral -- all 3 have      significant disease.  The right coronary artery is moderate size with 2      right ventricular branches, a small PDA, and a large and a small PLD.      These vessels free of significant disease.  Ejection fraction estimated      in left ventriculogram is 40%.  There was moderately severe 3+ mitral      regurgitation.  Interpretation of mitral regurgitation compromised by      ventricular arrhythmias.  2. July 03, 2006:  Implant single-chamber Medtronic cardioverter      defibrillator with defibrillator threshold study less than or equal to      15 joules, Dr. Sherryl Manges   BRIEF HISTORY:  [name] is a 60 year old female.  She has longstanding  history of  hypertension.  She presented with an episode of syncope.  She had  been dancing with some children, walked over to get something to drink,  noted that she was breathing heavily, and then without warning collapsed.  Her loss of consciousness estimated at about one minute or less.  She  regained consciousness and had no significant postictal state.   She was admitted to the hospital.  Her troponin I studies were elevated.  The CT scan was unrevealing.  She refers now for left and right heart  catheterization.   HOSPITAL COURSE:  The patient presents from [facility] with episode  of syncope.  On the day of admission at [facility], she had left and right  heart catheterization with results dictated above.  This is a nonischemic  cardiomyopathy.  She has an ejection fraction 40% and severe 3+ mitral  regurgitation.  She was seen in consultation by Dr.  Sherryl Manges.  He  believed that her ejection fraction was mildly overestimated because of  mitral regurgitation.  This puts Ms. Hauth in a cohort of patients at high  risk for sudden cardiac death.  He recommended implantation of cardioverter  defibrillator.  The patient and family considered this and eventually  decided to go ahead with the procedure.  This was done on July 03, 2006.  The defibrillator was placed without complication.  The patient has had no  post procedural complications, no hematoma, no cardiac dysrhythmias this  admission.  She will be ready for discharge postprocedure day #1.  Her  device has been interrogated and all values within normal limits.  The chest  x-ray inspected shows no pneumothorax with the lead in appropriate position  in the right ventricle.  Follow-up appointments have been made.  Immobility  of the left arm has been described to the patient.  Incision care,  essentially keeping the incision dry for the next 7 days has also been  explained.   DISCHARGE MEDICATIONS:  1. Enteric-coated aspirin 81  mg daily.  2. Coreg 25 mg twice daily.  3. Hydrochlorothiazide 25 mg daily.  4. Potassium chloride 20 mEq daily, this is new because the patient      presented with hypokalemia which was persistent on hydrochlorothiazide.  5. Lisinopril 10 mg daily.  6. Lipitor 10 mg daily at bedtime.  7. Avapro 300 mg daily.   FOLLOWUP:  She has followup at [facility], [address]  1. ICD clinic Monday July 21, 2006 at 9:40 a.m.  2. She will see Dr. Graciela Husbands in December, and his office will call with that      appointment.  3. She will see Dr. Andee Lineman at the [facility] office, Friday,      July 18, 2006 at 1:30 p.m.   PERTINENT LABORATORY STUDIES THIS ADMISSION:  Serum electrolytes after  replacing potassium on July 03, 2006:  Sodium 139, potassium 4 (prior to  that her potassium had been 3.3 on 3 successive days), chloride 104,  carbonates 28, BUN is 18, creatinine 1.1 and glucose 102.  Complete blood  count this admission:  Hemoglobin 11, hematocrit 33.4, white cells 10.9 and  platelets 240.  PT was 13.8, INR 1.0.   Discharge takes 30 minutes.           ______________________________  Maple Mirza, PA     GM/MEDQ  D:  07/04/2006  T:  07/04/2006  Job:  [id number]   cc:   Lavonna Rua, MD,FACC  Dellia Beckwith, Dr.

## 2011-03-29 NOTE — Assessment & Plan Note (Signed)
[facility]                            [facility] NOTE   DAYZHA, POGOSYAN                        MRN:          [id number]  DATE:07/25/2006                            DOB:          [birth year]    HISTORY OF PRESENT ILLNESS:  The patient is a 60 year old African-American  female recently diagnosed with nonischemic cardiomyopathy, ejection fraction  40%.  The patient was referred to Dr. Graciela Husbands after she was found to have  exercise-induced syncope.  It was felt that the patient was at increased  risk for sudden cardiac death and underwent implantation of a Medtronic  Maximo single-chamber cardioverter defibrillator.  Echocardiography  demonstrated that the patient has moderately severe mitral regurgitation.  This was confirmed by cardiac catheterization.  The patient is an NYHA Class  I/II.   The patient presents for followup in the office today.  She has done well  since implantation of her ICD device.  She reports no substernal chest pain,  orthopnea, or PND.  She has no palpitations or syncope.  She reports that  the incision site is well healed.  She reports no fever or chills.   MEDICATIONS:  1. Potassium 20 mEq p.o. daily.  2. Lisinopril 10 mg p.o. daily.  3. Hydrochlorothiazide 25 mg p.o. daily.  4. Coreg 25 mg p.o. b.i.d.  5. Aspirin 81 mg p.o. daily.  6. Lipitor 20 mg p.o. nightly.  7. Avapro 300 mg p.o. daily.   PHYSICAL EXAMINATION:  VITAL SIGNS: Blood pressure 132/80 mmHg, heart rate  72 beats per minute.  Weight 172 pounds.  NECK: No JVD.  LUNGS: Clear breath sounds bilaterally.  HEART: Regular rate and rhythm with normal S1, S2, no murmurs, rubs, or  gallops.  ABDOMEN: Soft, nontender with no rebound or guarding.  EXTREMITIES: No cyanosis, clubbing, or edema.  SKIN: The incision site in the left chest appears to be well healed.   PROBLEM LIST:  1. Nonischemic cardiomyopathy.  2. Moderately severe mitral  regurgitation.  3. Ejection fraction approximately 40% by catheterization and      echocardiography.  4. Status post exercise-induced syncope.  5. Status post single-lead chamber implantable cardioverter-defibrillator      implant.   The patient is doing well from a cardiovascular perspective.  She is  asymptomatic.   The patient will need close followup for her mitral regurgitation.  We will  repeat the echocardiogram in one month. A TEE may need to be scheduled at  that point in time. Consideration may need to be given to valve repair if  the patient is symptomatic.  Her decisions will be made after reviewing her  echocardiographic study.                                   Learta Codding, MD,FACC   GED/MedQ  DD:  07/29/2006  DT:  07/30/2006  Job #:  [id number]

## 2011-03-29 NOTE — Consult Note (Signed)
NAME:  ASHLEAH, VALTIERRA NO.:  [id number]   MEDICAL RECORD NO.:  [id number]          PATIENT TYPE:  INP   LOCATION:  2024                         FACILITY:  [facility]   PHYSICIAN:  Duke Salvia, MD,FACCDATE OF BIRTH:  06/28/1951   DATE OF CONSULTATION:  DATE OF DISCHARGE:                                   CONSULTATION   Thank you very for asking [place] to see Daiva Eves at Electrophysiology for  consultation for syncope.   She is a 60 year old African-American woman with longstanding hypertension,  previously cared for by Dr. Mayford Knife in [place] on good medical therapy  who had an episode of syncope.  She was at a birthday party.  She had been  doing some dancing with the children, essentially like jumping jacks.  She  stopped doing this.  She walked over to the bar to get something to drink  (non-alcoholic).  She was noted to be breathing heavily.  She started  walking back to the dancing area, and then without warning collapsed to the  floor.  Her loss of consciousness was estimated in the range of about a  minute or less.  Her eyes were open and non-responsive.  There is some  jerking.  She regained consciousness, and there was no significant postictal  state.   She was admitted to hospital.  Troponins were abnormal.  She underwent CT  scan which was unrevealing, and she was referred for catheterization which  demonstrated non-obstructive coronary disease and 3+ mitral regurgitation.  She had modest pulmonary hypertension.   Her functional capacity is mildly impaired.  She denies nocturnal dyspnea,  orthopnea or peripheral edema; however.  She denies palpitations or prior  syncope.   PAST MEDICAL HISTORY:  Notable for:  1. Dyslipidemia.  2. Anemia.   PAST SURGICAL HISTORY:  Hysterectomy.   PRIOR SOCIAL HISTORY:  She is married.  She no longer smokes.  She denies  alcohol.  She lives in [place].  She drives a school bus.   REVIEW OF SYSTEMS:  Noted on  the intake sheet from Dr. Andee Lineman and is not  further recounted.   MEDICATIONS:  Hydrochlorothiazide, aspirin, Lasix 20, Coreg 25 b.i.d.,  Avapro 300, Lipitor 10.   ALLERGIES:  SHE HAS NO KNOWN DRUG ALLERGIES.   PHYSICAL EXAMINATION:  GENERAL:  She is a middle-aged African-American  female, somewhat older than her stated age of 43.  VITAL SIGNS:  Her blood pressure was 170/90.  HEART:  Her heart was 69.  HEENT:  No icterus or xanthoma.  NECK:  Veins flat.  The carotids were brisk and full bilateral without  bruits.  BACK:  Without kyphosis, scoliosis.  LUNGS:  Clear.  HEART:  Revealed a harsh systolic murmur extending from the apex to the left  upper sternal border.  ABDOMEN:  Soft with active bowel sounds.  There as no clubbing, cyanosis or  edema.  NEUROLOGICAL:  Grossly normal.  SKIN:  Warm and dry.   LABORATORIES:  Were notable for a normal creatinine, hemoglobin of 11.   Electrocardiogram demonstrates a first-degree AV block.  I recall  the P-R  interval being about 0.25.   IMPRESSION:  1. Syncope - abrupt in post exercise.  2. Non-ischemic cardiomyopathy.      a.     EF of 40%.      b.     MR 3+.  3. Pulmonary hypertension.  4. Systemic hypertension.  5. Anemia.   DISCUSSION:  Mrs. Ellerbe has abrupt syncope in the setting of significant  cardiomyopathy, and ejection fraction which is mildly over-estimated because  of her mitral regurgitation.  Syncope in this covert of patients is a high-  risk marker for sudden death, whether or not it is related to a malignant  arrhythmia.  The post-exercise state suggested there could have been a vagal  component to it, although the aggravation of dyspnea prior to the syncope is  somewhat concerning.  The abrupt nature of the syncope and the abrupt  recovery from the syncope also speaks for her to be arrhythmic.   I reviewed these issues with the patient and her family.  I would recommend  ICD implantation for prevention of  sudden death, given the constellation of  symptoms.  They are aware that there may have been a vasodepressor, i.e.  blood pressure mechanism for her syncope and that syncope might recur.   I think the other issue is going to be management of her mitral  regurgitation in the context of her medical therapy which is already pretty  good.   I will defer the management and her decision making regarding her mitral  regurgitation to Dr. Andee Lineman.   We will plan ICD implantation tomorrow as the schedule permits.   I have reviewed with family the potential benefits, as well as potential  risks including but not limited to death, perforation, infection, lead  dislodgment, vascular injury/thrombosis and device malfunction.  They  understand these risks and are willing to proceed.   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   This is obviously devastating for her.           ______________________________  Duke Salvia, MD,FACC     SCK/MEDQ  D:  07/02/2006  T:  07/02/2006  Job:  [id number]   cc:   Lia Hopping  Electrophysiology Laboratory  [facility] ED  [facility]

## 2011-03-29 NOTE — Assessment & Plan Note (Signed)
[facility]                           ELECTROPHYSIOLOGY OFFICE NOTE   [name], [name]                        MRN:          [id number]  DATE:07/21/2006                            DOB:          1951-08-14    DEVICE CLINIC NOTE   [name] is seen in Device Clinic today, July 21, 2006, for postop  for followup of her recently implanted Maximo Model 7232CX Single Chamber  defibrillator.  Procedure done on July 03, 2006, by Dr. Graciela Husbands for  hypertrophic cardiomyopathy with syncope.   EXAM:  Site looks good without redness or swelling.  Steri-strips were  removed without incident.  Upon interrogation, battery voltage is 3.17 volts  with the last recorded charge time of 8.04 seconds.  She had 15 nonsustained  episodes, some which were short in nature and others which were  discriminated by wavelet discrimination.  In the right ventricle, intrinsic  amplitude is 15.4 millivolts with an impedence of 552 ohms and threshold of  1 volt at 0.1 milliseconds.  High voltage lead impedence is 44 ohms.  No  programming changes were made at this time.  [name] will be seen for a  followup in December by Dr. Graciela Husbands and begin [facility] Transmissions  following that.                                   Cleatrice Burke, RN                                Duke Salvia, MD, [facility]   CF/MedQ  DD:  07/21/2006  DT:  07/21/2006  Job #:  [id number]

## 2011-03-29 NOTE — Op Note (Signed)
[name] NO.:  [id number]   MEDICAL RECORD NO.:  [id number]          PATIENT TYPE:  INP   LOCATION:  2024                         FACILITY:  [facility]   PHYSICIAN:  Duke Salvia, MD,FACCDATE OF BIRTH:  1951/02/03   DATE OF PROCEDURE:  DATE OF DISCHARGE:                                 OPERATIVE REPORT   PREOPERATIVE DIAGNOSIS:  Syncope with hypertrophic  cardiomyopathy/hypertensive heart disease. Moderate to severe mitral  regurgitation, and left ventricular dysfunction.   POSTOPERATIVE DIAGNOSIS:  Syncope with hypertrophic  cardiomyopathy/hypertensive heart disease. Moderate to severe mitral  regurgitation, and left ventricular dysfunction.   PROCEDURE:  Single-chamber defibrillator implantation with intraoperative  defibrillation threshold testing.   PROCEDURE IN DETAIL:  Following the obtaining of informed consent, the  patient was brought to the electrophysiology laboratory and placed on the  fluoroscopic table in supine position.  After routine prep and drape of the  left upper chest, lidocaine was infiltrated in the prepectoral subclavicular  region.  An incision was made, then carried down to the layer of the  prepectoral fascia using electrocautery and sharp dissection.  A pocket was  formed similarly.  Hemostasis was obtained.   Thereafter, attention was turned to gain access to the extrathoracic left  subclavian vein which was accomplished without significant difficulty and  without the aspiration of air or puncture of the artery.  A single  venipuncture was accomplished.  A guide wire was placed and retained, and a  7 French sheath was placed, through which was then passed the Medtronic 6949  58-cm active fixation dual-coil defibrillator lead, serial number  [id number].  Under fluoroscopic guidance, it was manipulated at the right  ventricular apex where the bipolar R wave was 20.4 with an impedance of 1105  ohms, a threshold of 0.8  volts at 0.5 milliseconds.  Current threshold is  0.9 mA and there is no diaphragmatic pacing at 10 volts.  The lead was  secured to the prepectoral fascia and then attached to Medtronic Maximo  VR7232Cx ICD, serial number [id number].  Through the device, a bipolar R  wave was 14.3 with a pace impedance of 800 ohms, a threshold of 1 volt at  0.3 milliseconds.  Proximal coil impedance was 54 ohms.  Distal coil  impedance was 47 ohms.   At this point, defibrillation threshold testing was undertaken.  Ventricular  fibrillation was induced through the T wave shock.  After a total duration  of 5 seconds, a 15-joule shock was delivered through a measure of resistance  of 39 ohms, terminating in ventricular fibrillation and restoring sinus  rhythm.   After a wait of 5-6 minutes, ventricular fibrillation was re-induced through  the T wave shock.  After a total duration of 6 seconds, a 15-joule shock was  delivered through a measure of resistance of 40 ohms, terminating  ventricular fibrillation and restoring sinus rhythm.  There were two  dropouts at 1.2 millivolt sensitivity.   At this point, the device was implanted.  The pocket was copiously irrigated  with antibiotic-containing saline solution.  Hemostasis was assured.  The  leads and the pulse generator were placed in the pocket and secured to the  prepectoral fascia. The wound was  closed in three layers in the normal  fashion.  The wound was washed, dried, and a Benzoin and Steri-Strip  dressing was applied.  Needle counts, sponge counts and instrument counts  were correct at the end of the procedure according to the staff.  The  patient tolerated the procedure without apparent complication.           ______________________________  Duke Salvia, MD,FACC     SCK/MEDQ  D:  07/03/2006  T:  07/04/2006  Job:  [id number]   cc:   Learta Codding, MD,FACC  E. Graceann Congress, MD,FACC  Electrophysiology laboratory

## 2011-03-29 NOTE — Assessment & Plan Note (Signed)
[facility]                         ELECTROPHYSIOLOGY OFFICE NOTE   ROISIN, MONES                       MRN:          [id number]  DATE:02/04/2007                            DOB:          [birth year]    Mrs. Ressler is seen.  She hypertrophic cardiomyopathy and syncope.  She  is really doing very, very well at this point.   MEDICATIONS:  Are reviewed and are unchanged.   PHYSICAL EXAMINATION:  VITAL SIGNS: Her blood pressure is 133/72, pulse  74.  LUNGS: Clear.  CARDIAC:  Heart sounds were regular.  EXTREMITIES:  Without edema.   Interrogation of her Medtronic Maximo [id number] ICD demonstrates and R wave  of 12.7, impedance of 600, threshold 45 at 0.4.  Battery voltage is  3.17.   Mrs. Gieske is stable.  We will see her again in one year's time and  will follow her in the interim.     Duke Salvia, MD, [facility]  Electronically Signed    SCK/MedQ  DD: 02/04/2007  DT: 02/04/2007  Job #: [id number]

## 2011-04-10 ENCOUNTER — Other Ambulatory Visit: Payer: Self-pay | Admitting: *Deleted

## 2011-04-10 MED ORDER — LOSARTAN POTASSIUM 100 MG PO TABS: 100.0000 mg | ORAL_TABLET | Freq: Every day | ORAL | Status: DC

## 2011-04-10 MED ORDER — CARVEDILOL 25 MG PO TABS: 25.0000 mg | ORAL_TABLET | Freq: Two times a day (BID) | ORAL | Status: DC

## 2011-04-10 MED ORDER — HYDROCHLOROTHIAZIDE 25 MG PO TABS: 25.0000 mg | ORAL_TABLET | Freq: Every day | ORAL | Status: DC

## 2011-04-10 MED ORDER — POTASSIUM CHLORIDE CRYS ER 20 MEQ PO TBCR: 20.0000 meq | EXTENDED_RELEASE_TABLET | Freq: Every day | ORAL | Status: DC

## 2011-04-12 ENCOUNTER — Ambulatory Visit: Payer: Self-pay | Admitting: Cardiology

## 2011-04-24 ENCOUNTER — Encounter: Payer: Self-pay | Admitting: Internal Medicine

## 2011-04-26 ENCOUNTER — Ambulatory Visit (INDEPENDENT_AMBULATORY_CARE_PROVIDER_SITE_OTHER): Payer: PRIVATE HEALTH INSURANCE | Admitting: *Deleted

## 2011-04-26 DIAGNOSIS — I421 Obstructive hypertrophic cardiomyopathy: Secondary | ICD-10-CM

## 2011-04-26 DIAGNOSIS — Z950 Presence of cardiac pacemaker: Secondary | ICD-10-CM

## 2011-04-26 DIAGNOSIS — Z95810 Presence of automatic (implantable) cardiac defibrillator: Secondary | ICD-10-CM

## 2011-04-26 NOTE — Progress Notes (Signed)
icd check in clinic  

## 2011-05-17 ENCOUNTER — Other Ambulatory Visit: Payer: Self-pay | Admitting: *Deleted

## 2011-05-17 MED ORDER — CARVEDILOL 25 MG PO TABS: 25.0000 mg | ORAL_TABLET | Freq: Two times a day (BID) | ORAL | Status: DC

## 2011-05-17 MED ORDER — HYDROCHLOROTHIAZIDE 25 MG PO TABS: 25.0000 mg | ORAL_TABLET | Freq: Every day | ORAL | Status: DC

## 2011-06-06 ENCOUNTER — Other Ambulatory Visit: Payer: Self-pay | Admitting: Cardiology

## 2011-06-12 ENCOUNTER — Telehealth: Payer: Self-pay | Admitting: Internal Medicine

## 2011-06-12 NOTE — Telephone Encounter (Signed)
Rescheduled appointment from 9/21 to 9/27 @ 2pm per patient request.

## 2011-06-24 ENCOUNTER — Other Ambulatory Visit: Payer: Self-pay | Admitting: Cardiology

## 2011-06-26 ENCOUNTER — Ambulatory Visit: Payer: Self-pay | Admitting: Cardiology

## 2011-07-16 ENCOUNTER — Encounter: Payer: Self-pay | Admitting: Internal Medicine

## 2011-08-02 ENCOUNTER — Encounter: Payer: PRIVATE HEALTH INSURANCE | Admitting: *Deleted

## 2011-08-05 ENCOUNTER — Encounter: Payer: Self-pay | Admitting: Cardiology

## 2011-08-06 ENCOUNTER — Ambulatory Visit: Payer: Self-pay | Admitting: Cardiology

## 2011-08-08 ENCOUNTER — Encounter: Payer: Self-pay | Admitting: Internal Medicine

## 2011-08-08 ENCOUNTER — Ambulatory Visit (INDEPENDENT_AMBULATORY_CARE_PROVIDER_SITE_OTHER): Payer: PRIVATE HEALTH INSURANCE | Admitting: *Deleted

## 2011-08-08 DIAGNOSIS — I472 Ventricular tachycardia: Secondary | ICD-10-CM

## 2011-08-08 NOTE — Progress Notes (Signed)
icd check in clinic  

## 2011-08-30 ENCOUNTER — Other Ambulatory Visit: Payer: Self-pay | Admitting: *Deleted

## 2011-08-30 MED ORDER — ATORVASTATIN CALCIUM 20 MG PO TABS: 20.0000 mg | ORAL_TABLET | Freq: Every day | ORAL | Status: DC

## 2011-09-05 ENCOUNTER — Other Ambulatory Visit: Payer: Self-pay | Admitting: *Deleted

## 2011-09-05 MED ORDER — LOSARTAN POTASSIUM 100 MG PO TABS: 100.0000 mg | ORAL_TABLET | Freq: Every day | ORAL | Status: DC

## 2011-09-25 ENCOUNTER — Ambulatory Visit (INDEPENDENT_AMBULATORY_CARE_PROVIDER_SITE_OTHER): Payer: PRIVATE HEALTH INSURANCE | Admitting: Cardiology

## 2011-09-25 ENCOUNTER — Encounter: Payer: Self-pay | Admitting: Cardiology

## 2011-09-25 DIAGNOSIS — Z79899 Other long term (current) drug therapy: Secondary | ICD-10-CM

## 2011-09-25 DIAGNOSIS — E785 Hyperlipidemia, unspecified: Secondary | ICD-10-CM

## 2011-09-25 DIAGNOSIS — I421 Obstructive hypertrophic cardiomyopathy: Secondary | ICD-10-CM

## 2011-09-25 DIAGNOSIS — R55 Syncope and collapse: Secondary | ICD-10-CM

## 2011-09-25 DIAGNOSIS — I472 Ventricular tachycardia, unspecified: Secondary | ICD-10-CM

## 2011-09-25 DIAGNOSIS — I428 Other cardiomyopathies: Secondary | ICD-10-CM

## 2011-09-25 DIAGNOSIS — Z95810 Presence of automatic (implantable) cardiac defibrillator: Secondary | ICD-10-CM

## 2011-09-25 NOTE — Assessment & Plan Note (Signed)
No ICD discharges recorded

## 2011-09-25 NOTE — Assessment & Plan Note (Signed)
Followup echocardiogram. Assess ejection fraction and evaluate degree of mitral regurgitation. Patient on a good medical regimen and will continue this. Patient has good functional class and no indication for calcium channel blockers or disopyramide.

## 2011-09-25 NOTE — Assessment & Plan Note (Signed)
Followup with Dr. Graciela Husbands in the EP clinic scheduled for next week.

## 2011-09-25 NOTE — Assessment & Plan Note (Signed)
Lipid panel and additional laboratory work will be obtained.

## 2011-09-25 NOTE — Patient Instructions (Signed)
   Echo Your physician recommends that you go to the [facility] for lab work.  Reminder:  Nothing to eat or drink after 12 midnight prior to labs. If the results of your test are normal or stable, you will receive a letter.  If they are abnormal, the nurse will contact you by phone. Your physician wants you to follow up in: 6 months.  You will receive a reminder letter in the mail one-two months in advance.  If you don't receive a letter, please call our office to schedule the follow up appointment

## 2011-09-25 NOTE — Progress Notes (Signed)
CC: Followup patient with hypertrophic cardiomyopathy  HPI: The patient is a 60 year old female with a prior history of syncope and hypertrophic cardiomyopathy. She status post ICD implantation with lead revision secondary to a 6949 lead fracture. The latter procedure was performed in 2010. The patient is followed in EP clinic. She reports no discharges. She has had no recurrent syncope. She currently is in NYHA class 1/2. She denies any chest pain. She'll have shortness of breath on moderate exertion. She has a prior history of LV dysfunction with moderate to severe mitral regurgitation. She denies any palpitations, orthopnea PND. She is on a good medical regimen and is very compliant with this. In addition she reports no edema.  PMH: reviewed and listed in Problem List in Electronic Records (and see below) Past Medical History  Diagnosis Date  . Ventricular tachycardia   . Mitral regurgitation     3,4 plus  . Cardiomyopathy, hypertrophic obstructive   . ICD (implantable cardiac defibrillator), single, in situ   . Syncope and collapse   . Hyperlipidemia, mixed   . Unspecified essential hypertension   . Anemia      Allergies/SH/FHX : available in Electronic Records for review  Medications: Current Outpatient Prescriptions  Medication Sig Dispense Refill  . acetaminophen (TYLENOL) 500 MG tablet Take 500 mg by mouth every 6 (six) hours as needed.        Marland Kitchen aspirin 81 MG tablet Take 81 mg by mouth daily.        Marland Kitchen atorvastatin (LIPITOR) 20 MG tablet Take 1 tablet (20 mg total) by mouth daily.  30 tablet  0  . carvedilol (COREG) 25 MG tablet Take 1 tablet (25 mg total) by mouth 2 (two) times daily.  60 tablet  1  . hydrochlorothiazide 25 MG tablet Take 1 tablet (25 mg total) by mouth daily.  30 tablet  1  . losartan (COZAAR) 100 MG tablet Take 1 tablet (100 mg total) by mouth daily.  30 tablet  0  . potassium chloride SA (K-DUR,KLOR-CON) 20 MEQ tablet TAKE 1 TABLET BY MOUTH DAILY  30 tablet   6    ROS: No nausea or vomiting. No fever or chills.No melena or hematochezia.No bleeding.No claudication  Physical Exam: BP 141/84  Pulse 65  Ht 4\' 9"  (1.448 m)  Wt 188 lb (85.276 kg)  BMI 40.68 kg/m2 General: Well-nourished African American female in no distress  Neck: Normal carotid upstroke no definite spike and dome. No carotid bruits no thyromegaly nonnodular thyroid. Lungs: Clear breath sounds bilaterally no wheezing Cardiac: Regular rate and rhythm. 3/6 crescendo decrescendo murmur loudest at the left lower sternal border increased with Valsalva. Normal S1 and S2. 2/6 holosystolic murmur anterior axillary line. Vascular: No edema. Normal dorsalis and posterior tibial pulses Skin: Warm and dry  12lead ECG: Normal sinus rhythm nonspecific ST-T wave changes. [EKG not diagnostic of hypertrophic cardiomyopathy] Limited bedside ECHO:N/A   Assessment and Plan

## 2011-09-30 ENCOUNTER — Other Ambulatory Visit: Payer: Self-pay | Admitting: *Deleted

## 2011-09-30 MED ORDER — ATORVASTATIN CALCIUM 20 MG PO TABS: 20.0000 mg | ORAL_TABLET | Freq: Every day | ORAL | Status: DC

## 2011-10-07 ENCOUNTER — Other Ambulatory Visit: Payer: Self-pay | Admitting: *Deleted

## 2011-10-07 MED ORDER — LOSARTAN POTASSIUM 100 MG PO TABS: 100.0000 mg | ORAL_TABLET | Freq: Every day | ORAL | Status: DC

## 2011-10-10 ENCOUNTER — Other Ambulatory Visit: Payer: PRIVATE HEALTH INSURANCE | Admitting: *Deleted

## 2011-10-17 ENCOUNTER — Other Ambulatory Visit (INDEPENDENT_AMBULATORY_CARE_PROVIDER_SITE_OTHER): Payer: PRIVATE HEALTH INSURANCE | Admitting: *Deleted

## 2011-10-17 ENCOUNTER — Telehealth: Payer: Self-pay | Admitting: Cardiology

## 2011-10-17 DIAGNOSIS — I428 Other cardiomyopathies: Secondary | ICD-10-CM

## 2011-10-17 DIAGNOSIS — R55 Syncope and collapse: Secondary | ICD-10-CM

## 2011-10-17 DIAGNOSIS — I059 Rheumatic mitral valve disease, unspecified: Secondary | ICD-10-CM

## 2011-10-17 NOTE — Telephone Encounter (Signed)
Recall is on generic Lipitor only. Non-generic formulation is fine. The generic has been recall because of glass particles found in some badges.

## 2011-10-17 NOTE — Telephone Encounter (Signed)
See note, please advise. 

## 2011-10-21 ENCOUNTER — Other Ambulatory Visit: Payer: Self-pay | Admitting: *Deleted

## 2011-10-21 DIAGNOSIS — D509 Iron deficiency anemia, unspecified: Secondary | ICD-10-CM

## 2011-10-21 DIAGNOSIS — N289 Disorder of kidney and ureter, unspecified: Secondary | ICD-10-CM

## 2011-10-22 ENCOUNTER — Encounter: Payer: Self-pay | Admitting: Internal Medicine

## 2011-10-22 ENCOUNTER — Ambulatory Visit (INDEPENDENT_AMBULATORY_CARE_PROVIDER_SITE_OTHER): Payer: PRIVATE HEALTH INSURANCE | Admitting: Internal Medicine

## 2011-10-22 DIAGNOSIS — R55 Syncope and collapse: Secondary | ICD-10-CM

## 2011-10-22 DIAGNOSIS — Z95810 Presence of automatic (implantable) cardiac defibrillator: Secondary | ICD-10-CM

## 2011-10-22 DIAGNOSIS — I421 Obstructive hypertrophic cardiomyopathy: Secondary | ICD-10-CM

## 2011-10-22 LAB — ICD DEVICE OBSERVATION
BRDY-0002RV: 40 {beats}/min
RV LEAD IMPEDENCE ICD: 416 Ohm
TZAT-0001FASTVT: 1
TZAT-0001SLOWVT: 1
TZAT-0001SLOWVT: 2
TZAT-0004FASTVT: 8
TZAT-0004SLOWVT: 8
TZAT-0004SLOWVT: 8
TZAT-0005FASTVT: 88 pct
TZAT-0012SLOWVT: 200 ms
TZAT-0018FASTVT: NEGATIVE
TZAT-0019SLOWVT: 8 V
TZAT-0020SLOWVT: 1.6 ms
TZAT-0020SLOWVT: 1.6 ms
TZON-0003SLOWVT: 350 ms
TZON-0011AFLUTTER: 70
TZST-0001FASTVT: 2
TZST-0001FASTVT: 5
TZST-0001SLOWVT: 3
TZST-0001SLOWVT: 5
TZST-0003FASTVT: 35 J
TZST-0003FASTVT: 35 J
TZST-0003SLOWVT: 25 J

## 2011-10-22 NOTE — Patient Instructions (Addendum)
Your physician recommends that you schedule a follow-up appointment in: 3 months in [place] device clinic (Medtronic defib)  Your physician recommends that you continue on your current medications as directed. Please refer to the Current Medication list given to you today.

## 2011-10-22 NOTE — Progress Notes (Signed)
  HPI  [name] is a 60 y.o. female Seen in followup for syncope in the setting of Hypertrophic cardiomyopathy. She is status post ICD implantation The patient denies chest pain, shortness of breath, nocturnal dyspnea, orthopnea or peripheral edema.  There have been no palpitations, lightheadedness or syncope.   Her daughter is being evaluated by DR Mayford Knife  Will await echo  Past Medical History  Diagnosis Date  . Ventricular tachycardia   . Mitral regurgitation     3,4 plus  . Cardiomyopathy, hypertrophic obstructive   . ICD (implantable cardiac defibrillator), single, in situ   . Syncope and collapse   . Hyperlipidemia, mixed   . Unspecified essential hypertension   . Anemia     Past Surgical History  Procedure Date  . Total abdominal hysterectomy   . Bilateral salpingoophorectomy   . Cardiac defibrillator placement 02/2009    Medtronic Maximo VR    Current Outpatient Prescriptions  Medication Sig Dispense Refill  . acetaminophen (TYLENOL) 500 MG tablet Take 500 mg by mouth every 6 (six) hours as needed.        Marland Kitchen aspirin 81 MG tablet Take 81 mg by mouth daily.        Marland Kitchen atorvastatin (LIPITOR) 20 MG tablet Take 1 tablet (20 mg total) by mouth daily. NEED CHOLESTEROL LABS  30 tablet  0  . carvedilol (COREG) 25 MG tablet Take 1 tablet (25 mg total) by mouth 2 (two) times daily.  60 tablet  1  . hydrochlorothiazide 25 MG tablet Take 1 tablet (25 mg total) by mouth daily.  30 tablet  1  . losartan (COZAAR) 100 MG tablet Take 1 tablet (100 mg total) by mouth daily.  30 tablet  6  . potassium chloride SA (K-DUR,KLOR-CON) 20 MEQ tablet TAKE 1 TABLET BY MOUTH DAILY  30 tablet  6    No Known Allergies  Review of Systems negative except from HPI and PMH  Physical Exam Well developed and well nourished in no acute distress HENT normal E scleral and icterus clear Neck Supple JVP flat; carotids brisk and full Clear to ausculation Regular rate and rhythm, no murmurs gallops or  rub Soft with active bowel sounds No clubbing cyanosis none Edema Alert and oriented, grossly normal motor and sensory function Skin Warm and Dry   Assessment and  Plan

## 2011-10-22 NOTE — Assessment & Plan Note (Signed)
The patient's device was interrogated.  The information was reviewed. No changes were made in the programming.    

## 2011-10-22 NOTE — Assessment & Plan Note (Signed)
No recurrence syncope 

## 2011-10-22 NOTE — Progress Notes (Signed)
   Patient ID: [name], female    DOB: January 05, 1951, 60 y.o.   MRN: [id number]  HPI    Review of Systems    Physical Exam

## 2011-10-22 NOTE — Assessment & Plan Note (Signed)
Stable; the family needs to get screened. Currently this will be done by ultrasound. Additionally can do it by genetic testing

## 2011-10-24 NOTE — Telephone Encounter (Signed)
Left message to return call 

## 2011-10-25 NOTE — Telephone Encounter (Signed)
Patient notified of below.   

## 2011-10-30 ENCOUNTER — Other Ambulatory Visit: Payer: Self-pay | Admitting: Cardiology

## 2012-01-21 ENCOUNTER — Other Ambulatory Visit: Payer: Self-pay | Admitting: *Deleted

## 2012-01-21 MED ORDER — POTASSIUM CHLORIDE CRYS ER 20 MEQ PO TBCR: 20.0000 meq | EXTENDED_RELEASE_TABLET | Freq: Every day | ORAL | Status: DC

## 2012-01-27 ENCOUNTER — Telehealth: Payer: Self-pay | Admitting: *Deleted

## 2012-01-27 NOTE — Telephone Encounter (Signed)
Message left on nurse's voicemail that she no longer uses [facility] and now uses [facility] on [address] In [place]. Pharmacy profile updated.

## 2012-01-31 ENCOUNTER — Encounter: Payer: Self-pay | Admitting: Internal Medicine

## 2012-01-31 ENCOUNTER — Encounter: Payer: PRIVATE HEALTH INSURANCE | Admitting: *Deleted

## 2012-01-31 ENCOUNTER — Ambulatory Visit (INDEPENDENT_AMBULATORY_CARE_PROVIDER_SITE_OTHER): Payer: 59 | Admitting: *Deleted

## 2012-01-31 DIAGNOSIS — Z95810 Presence of automatic (implantable) cardiac defibrillator: Secondary | ICD-10-CM

## 2012-01-31 DIAGNOSIS — I421 Obstructive hypertrophic cardiomyopathy: Secondary | ICD-10-CM

## 2012-01-31 LAB — ICD DEVICE OBSERVATION
BATTERY VOLTAGE: 2.92 V
FVT: 0
RV LEAD AMPLITUDE: 9.8 mv
RV LEAD IMPEDENCE ICD: 440 Ohm
TZAT-0001FASTVT: 1
TZAT-0001SLOWVT: 2
TZAT-0004FASTVT: 8
TZAT-0004SLOWVT: 8
TZAT-0004SLOWVT: 8
TZAT-0005FASTVT: 88 pct
TZAT-0005SLOWVT: 84 pct
TZAT-0005SLOWVT: 91 pct
TZAT-0011FASTVT: 10 ms
TZAT-0012SLOWVT: 200 ms
TZAT-0012SLOWVT: 200 ms
TZAT-0013SLOWVT: 2
TZAT-0018FASTVT: NEGATIVE
TZAT-0020SLOWVT: 1.6 ms
TZAT-0020SLOWVT: 1.6 ms
TZON-0003SLOWVT: 350 ms
TZON-0004SLOWVT: 20
TZON-0011AFLUTTER: 70
TZST-0001FASTVT: 2
TZST-0001FASTVT: 3
TZST-0001FASTVT: 5
TZST-0001SLOWVT: 3
TZST-0001SLOWVT: 5
TZST-0001SLOWVT: 6
TZST-0003FASTVT: 25 J
TZST-0003FASTVT: 35 J
TZST-0003FASTVT: 35 J
TZST-0003SLOWVT: 25 J
VENTRICULAR PACING ICD: 0 pct
VF: 0

## 2012-01-31 NOTE — Progress Notes (Signed)
icd check in clinic  

## 2012-02-21 ENCOUNTER — Other Ambulatory Visit: Payer: Self-pay | Admitting: Internal Medicine

## 2012-02-21 MED ORDER — HYDROCHLOROTHIAZIDE 25 MG PO TABS: 25.0000 mg | ORAL_TABLET | Freq: Every day | ORAL | Status: DC

## 2012-02-27 ENCOUNTER — Other Ambulatory Visit: Payer: Self-pay

## 2012-02-27 MED ORDER — CARVEDILOL 25 MG PO TABS: 25.0000 mg | ORAL_TABLET | Freq: Two times a day (BID) | ORAL | Status: DC

## 2012-02-27 NOTE — Telephone Encounter (Signed)
.   Requested Prescriptions   Signed Prescriptions Disp Refills  . carvedilol (COREG) 25 MG tablet 60 tablet 6    Sig: Take 1 tablet (25 mg total) by mouth 2 (two) times daily.    Authorizing Provider: Hillis Range    Ordering User: Lacie Scotts

## 2012-04-24 ENCOUNTER — Ambulatory Visit (INDEPENDENT_AMBULATORY_CARE_PROVIDER_SITE_OTHER): Payer: 59 | Admitting: *Deleted

## 2012-04-24 ENCOUNTER — Encounter: Payer: Self-pay | Admitting: Internal Medicine

## 2012-04-24 DIAGNOSIS — I428 Other cardiomyopathies: Secondary | ICD-10-CM

## 2012-04-24 LAB — ICD DEVICE OBSERVATION
BATTERY VOLTAGE: 2.89 V
BRDY-0002RV: 40 {beats}/min
CHARGE TIME: 8.83 s
RV LEAD IMPEDENCE ICD: 440 Ohm
TZAT-0001FASTVT: 1
TZAT-0004FASTVT: 8
TZAT-0004SLOWVT: 8
TZAT-0004SLOWVT: 8
TZAT-0005FASTVT: 88 pct
TZAT-0011FASTVT: 10 ms
TZAT-0011SLOWVT: 10 ms
TZAT-0011SLOWVT: 10 ms
TZAT-0012SLOWVT: 200 ms
TZAT-0012SLOWVT: 200 ms
TZAT-0013FASTVT: 1
TZAT-0020SLOWVT: 1.6 ms
TZON-0003SLOWVT: 350 ms
TZON-0011AFLUTTER: 70
TZST-0001FASTVT: 2
TZST-0001FASTVT: 5
TZST-0001SLOWVT: 5
TZST-0003FASTVT: 35 J
TZST-0003FASTVT: 35 J
TZST-0003FASTVT: 35 J
TZST-0003SLOWVT: 25 J
TZST-0003SLOWVT: 35 J
TZST-0003SLOWVT: 35 J
VENTRICULAR PACING ICD: 0.1 pct

## 2012-04-24 NOTE — Progress Notes (Signed)
defib check in clinic  

## 2012-05-04 ENCOUNTER — Other Ambulatory Visit: Payer: Self-pay | Admitting: Cardiology

## 2012-05-04 MED ORDER — LOSARTAN POTASSIUM 100 MG PO TABS: 100.0000 mg | ORAL_TABLET | Freq: Every day | ORAL | Status: DC

## 2012-06-08 ENCOUNTER — Other Ambulatory Visit: Payer: Self-pay | Admitting: *Deleted

## 2012-06-08 MED ORDER — ATORVASTATIN CALCIUM 20 MG PO TABS: 20.0000 mg | ORAL_TABLET | Freq: Every day | ORAL | Status: DC

## 2012-07-24 ENCOUNTER — Ambulatory Visit (INDEPENDENT_AMBULATORY_CARE_PROVIDER_SITE_OTHER): Payer: 59 | Admitting: *Deleted

## 2012-07-24 DIAGNOSIS — I472 Ventricular tachycardia: Secondary | ICD-10-CM

## 2012-07-24 DIAGNOSIS — R55 Syncope and collapse: Secondary | ICD-10-CM

## 2012-07-24 LAB — ICD DEVICE OBSERVATION
BRDY-0002RV: 40 {beats}/min
CHARGE TIME: 9.33 s
DEV-0020ICD: NEGATIVE
RV LEAD AMPLITUDE: 9.7 mv
RV LEAD IMPEDENCE ICD: 432 Ohm
RV LEAD THRESHOLD: 1 V
TZAT-0001FASTVT: 1
TZAT-0001SLOWVT: 1
TZAT-0001SLOWVT: 2
TZAT-0004SLOWVT: 8
TZAT-0011FASTVT: 10 ms
TZAT-0013SLOWVT: 1
TZAT-0013SLOWVT: 2
TZAT-0018FASTVT: NEGATIVE
TZAT-0018SLOWVT: NEGATIVE
TZAT-0018SLOWVT: NEGATIVE
TZAT-0019FASTVT: 8 V
TZAT-0020FASTVT: 1.6 ms
TZAT-0020SLOWVT: 1.6 ms
TZAT-0020SLOWVT: 1.6 ms
TZON-0004SLOWVT: 20
TZON-0005SLOWVT: 12
TZON-0008FASTVT: 0 ms
TZST-0001FASTVT: 2
TZST-0001FASTVT: 3
TZST-0001FASTVT: 4
TZST-0001FASTVT: 6
TZST-0001SLOWVT: 3
TZST-0001SLOWVT: 5
TZST-0001SLOWVT: 6
TZST-0003FASTVT: 35 J
TZST-0003SLOWVT: 35 J

## 2012-07-24 NOTE — Progress Notes (Signed)
defib check in clinic  

## 2012-08-25 ENCOUNTER — Encounter: Payer: Self-pay | Admitting: Internal Medicine

## 2012-09-01 ENCOUNTER — Other Ambulatory Visit: Payer: Self-pay | Admitting: *Deleted

## 2012-09-01 MED ORDER — HYDROCHLOROTHIAZIDE 25 MG PO TABS: 25.0000 mg | ORAL_TABLET | Freq: Every day | ORAL | Status: DC

## 2012-09-03 ENCOUNTER — Other Ambulatory Visit: Payer: Self-pay | Admitting: Cardiology

## 2012-09-03 MED ORDER — ATORVASTATIN CALCIUM 20 MG PO TABS: 20.0000 mg | ORAL_TABLET | Freq: Every day | ORAL | Status: DC

## 2012-10-14 ENCOUNTER — Ambulatory Visit (INDEPENDENT_AMBULATORY_CARE_PROVIDER_SITE_OTHER): Payer: 59 | Admitting: Internal Medicine

## 2012-10-14 ENCOUNTER — Encounter: Payer: Self-pay | Admitting: Internal Medicine

## 2012-10-14 DIAGNOSIS — I421 Obstructive hypertrophic cardiomyopathy: Secondary | ICD-10-CM

## 2012-10-14 DIAGNOSIS — Z95810 Presence of automatic (implantable) cardiac defibrillator: Secondary | ICD-10-CM

## 2012-10-14 LAB — ICD DEVICE OBSERVATION
DEV-0020ICD: NEGATIVE
FVT: 0
RV LEAD AMPLITUDE: 10.3 mv
RV LEAD IMPEDENCE ICD: 448 Ohm
RV LEAD THRESHOLD: 1 V
TZAT-0001SLOWVT: 1
TZAT-0001SLOWVT: 2
TZAT-0004SLOWVT: 8
TZAT-0005SLOWVT: 84 pct
TZAT-0005SLOWVT: 91 pct
TZAT-0011FASTVT: 10 ms
TZAT-0012FASTVT: 200 ms
TZAT-0012SLOWVT: 200 ms
TZAT-0012SLOWVT: 200 ms
TZAT-0013FASTVT: 1
TZAT-0013SLOWVT: 1
TZAT-0013SLOWVT: 2
TZAT-0018FASTVT: NEGATIVE
TZAT-0018SLOWVT: NEGATIVE
TZAT-0018SLOWVT: NEGATIVE
TZAT-0019FASTVT: 8 V
TZON-0003FASTVT: 240 ms
TZON-0003SLOWVT: 350 ms
TZON-0004SLOWVT: 32
TZON-0005SLOWVT: 12
TZON-0008FASTVT: 0 ms
TZON-0011AFLUTTER: 70
TZST-0001FASTVT: 3
TZST-0001FASTVT: 5
TZST-0001FASTVT: 6
TZST-0001SLOWVT: 3
TZST-0001SLOWVT: 4
TZST-0001SLOWVT: 6
TZST-0003FASTVT: 35 J
TZST-0003FASTVT: 35 J
TZST-0003SLOWVT: 35 J
TZST-0003SLOWVT: 35 J

## 2012-10-14 MED ORDER — LOSARTAN POTASSIUM 100 MG PO TABS: 100.0000 mg | ORAL_TABLET | Freq: Every day | ORAL | Status: DC

## 2012-10-14 MED ORDER — POTASSIUM CHLORIDE CRYS ER 20 MEQ PO TBCR: 20.0000 meq | EXTENDED_RELEASE_TABLET | Freq: Every day | ORAL | Status: DC

## 2012-10-14 MED ORDER — CARVEDILOL 25 MG PO TABS: 25.0000 mg | ORAL_TABLET | Freq: Two times a day (BID) | ORAL | Status: DC

## 2012-10-14 MED ORDER — HYDROCHLOROTHIAZIDE 25 MG PO TABS: 25.0000 mg | ORAL_TABLET | Freq: Every day | ORAL | Status: DC

## 2012-10-14 MED ORDER — ATORVASTATIN CALCIUM 20 MG PO TABS: 20.0000 mg | ORAL_TABLET | Freq: Every day | ORAL | Status: DC

## 2012-10-14 NOTE — Progress Notes (Signed)
  HPI  [name] is a 61 y.o. female Seen in followup for syncope in the setting of Hypertrophic cardiomyopathy. She is status post ICD implantation The patient denies chest pain, shortness of breath, nocturnal dyspnea, orthopnea or peripheral edema.  There have been no palpitations, lightheadedness or syncope.     Past Medical History  Diagnosis Date  . Ventricular tachycardia   . Mitral regurgitation     3,4 plus  . Cardiomyopathy, hypertrophic obstructive   . ICD (implantable cardiac defibrillator), single, in situ   . Syncope and collapse   . Hyperlipidemia, mixed   . Unspecified essential hypertension   . Anemia     Past Surgical History  Procedure Date  . Total abdominal hysterectomy   . Bilateral salpingoophorectomy   . Cardiac defibrillator placement 02/2009    Medtronic Maximo VR    Current Outpatient Prescriptions  Medication Sig Dispense Refill  . acetaminophen (TYLENOL) 500 MG tablet Take 500 mg by mouth every 6 (six) hours as needed.        Marland Kitchen aspirin 81 MG tablet Take 81 mg by mouth daily.        Marland Kitchen atorvastatin (LIPITOR) 20 MG tablet Take 1 tablet (20 mg total) by mouth daily.  30 tablet  1  . carvedilol (COREG) 25 MG tablet Take 1 tablet (25 mg total) by mouth 2 (two) times daily.  60 tablet  6  . hydrochlorothiazide (HYDRODIURIL) 25 MG tablet Take 1 tablet (25 mg total) by mouth daily.  30 tablet  5  . losartan (COZAAR) 100 MG tablet Take 1 tablet (100 mg total) by mouth daily.  30 tablet  6  . potassium chloride SA (K-DUR,KLOR-CON) 20 MEQ tablet Take 1 tablet (20 mEq total) by mouth daily.  90 tablet  3    No Known Allergies  Review of Systems negative except from HPI and PMH  Physical Exam Well developed and well nourished in no acute distress HENT normal E scleral and icterus clear Neck Supple Clear to ausculation Regular rate and rhythm, no murmurs gallops or rub Soft with active bowel sounds No clubbing cyanosis none Edema Alert and  oriented, grossly normal motor and sensory function Skin Warm and Dry   Assessment and  Plan

## 2012-10-14 NOTE — Assessment & Plan Note (Signed)
The patient's device was interrogated.  The information was reviewed. No changes were made in the programming.    

## 2012-10-14 NOTE — Assessment & Plan Note (Signed)
.  stabe  

## 2012-10-14 NOTE — Patient Instructions (Signed)
Your physician wants you to follow-up in: 1 year with Dr Klein.  You will receive a reminder letter in the mail two months in advance. If you don't receive a letter, please call our office to schedule the follow-up appointment.  

## 2012-10-15 ENCOUNTER — Other Ambulatory Visit: Payer: Self-pay | Admitting: *Deleted

## 2012-10-15 DIAGNOSIS — I340 Nonrheumatic mitral (valve) insufficiency: Secondary | ICD-10-CM

## 2012-10-21 ENCOUNTER — Other Ambulatory Visit: Payer: 59

## 2012-10-26 ENCOUNTER — Encounter: Payer: Self-pay | Admitting: Internal Medicine

## 2012-10-30 ENCOUNTER — Other Ambulatory Visit: Payer: Self-pay | Admitting: *Deleted

## 2012-10-30 DIAGNOSIS — I421 Obstructive hypertrophic cardiomyopathy: Secondary | ICD-10-CM

## 2012-10-30 DIAGNOSIS — Z95810 Presence of automatic (implantable) cardiac defibrillator: Secondary | ICD-10-CM

## 2012-10-30 MED ORDER — ATORVASTATIN CALCIUM 20 MG PO TABS: 20.0000 mg | ORAL_TABLET | Freq: Every day | ORAL | Status: DC

## 2012-11-18 ENCOUNTER — Other Ambulatory Visit: Payer: 59

## 2012-12-04 ENCOUNTER — Other Ambulatory Visit: Payer: Self-pay | Admitting: *Deleted

## 2012-12-04 DIAGNOSIS — Z95810 Presence of automatic (implantable) cardiac defibrillator: Secondary | ICD-10-CM

## 2012-12-04 DIAGNOSIS — I421 Obstructive hypertrophic cardiomyopathy: Secondary | ICD-10-CM

## 2012-12-04 MED ORDER — CARVEDILOL 25 MG PO TABS: 25.0000 mg | ORAL_TABLET | Freq: Two times a day (BID) | ORAL | Status: DC

## 2012-12-04 MED ORDER — LOSARTAN POTASSIUM 100 MG PO TABS: 100.0000 mg | ORAL_TABLET | Freq: Every day | ORAL | Status: DC

## 2012-12-09 ENCOUNTER — Other Ambulatory Visit: Payer: 59

## 2012-12-23 ENCOUNTER — Other Ambulatory Visit: Payer: 59

## 2013-01-06 ENCOUNTER — Other Ambulatory Visit: Payer: 59

## 2013-01-20 ENCOUNTER — Other Ambulatory Visit: Payer: 59

## 2013-01-22 ENCOUNTER — Telehealth: Payer: Self-pay | Admitting: Cardiology

## 2013-01-22 ENCOUNTER — Other Ambulatory Visit: Payer: Self-pay | Admitting: Cardiology

## 2013-01-22 DIAGNOSIS — Z95810 Presence of automatic (implantable) cardiac defibrillator: Secondary | ICD-10-CM

## 2013-01-22 DIAGNOSIS — I421 Obstructive hypertrophic cardiomyopathy: Secondary | ICD-10-CM

## 2013-01-22 MED ORDER — POTASSIUM CHLORIDE CRYS ER 20 MEQ PO TBCR: 20.0000 meq | EXTENDED_RELEASE_TABLET | Freq: Every day | ORAL | Status: DC

## 2013-01-22 NOTE — Telephone Encounter (Signed)
Called pt and verified pharmacy to send Potassium to. Also spoke with patient about primary cardiology. Pt pacemaker is followed by Dr. Graciela Husbands in [place] office. Pt has decided to receive primary cardiology from Dr. Earnestine Leys. I gave pt Dr. Earnestine Leys new office number and informed her that we would no longer fill her primary cardiology medications that Dr. Earnestine Leys would take over from this point on.

## 2013-01-22 NOTE — Telephone Encounter (Signed)
PT NEEDS POTASSIUM CALLED IN

## 2013-02-03 ENCOUNTER — Other Ambulatory Visit: Payer: Self-pay

## 2013-02-03 ENCOUNTER — Other Ambulatory Visit (INDEPENDENT_AMBULATORY_CARE_PROVIDER_SITE_OTHER): Payer: 59

## 2013-02-03 DIAGNOSIS — I059 Rheumatic mitral valve disease, unspecified: Secondary | ICD-10-CM

## 2013-02-03 DIAGNOSIS — I421 Obstructive hypertrophic cardiomyopathy: Secondary | ICD-10-CM

## 2013-02-03 DIAGNOSIS — I340 Nonrheumatic mitral (valve) insufficiency: Secondary | ICD-10-CM

## 2013-02-05 ENCOUNTER — Encounter: Payer: Self-pay | Admitting: Internal Medicine

## 2013-02-05 ENCOUNTER — Other Ambulatory Visit: Payer: Self-pay

## 2013-02-05 ENCOUNTER — Ambulatory Visit (INDEPENDENT_AMBULATORY_CARE_PROVIDER_SITE_OTHER): Payer: 59 | Admitting: *Deleted

## 2013-02-05 DIAGNOSIS — I421 Obstructive hypertrophic cardiomyopathy: Secondary | ICD-10-CM

## 2013-02-05 LAB — ICD DEVICE OBSERVATION
BATTERY VOLTAGE: 2.69 V
BRDY-0002RV: 40 {beats}/min
CHARGE TIME: 10.18 s
PACEART VT: 0
TZAT-0001FASTVT: 1
TZAT-0004FASTVT: 8
TZAT-0004SLOWVT: 8
TZAT-0004SLOWVT: 8
TZAT-0005FASTVT: 88 pct
TZAT-0011SLOWVT: 10 ms
TZAT-0011SLOWVT: 10 ms
TZAT-0012SLOWVT: 200 ms
TZAT-0012SLOWVT: 200 ms
TZAT-0013FASTVT: 1
TZAT-0019SLOWVT: 8 V
TZAT-0019SLOWVT: 8 V
TZAT-0020FASTVT: 1.6 ms
TZAT-0020SLOWVT: 1.6 ms
TZAT-0020SLOWVT: 1.6 ms
TZON-0003SLOWVT: 350 ms
TZON-0008SLOWVT: 0 ms
TZON-0011AFLUTTER: 70
TZST-0001FASTVT: 2
TZST-0001FASTVT: 4
TZST-0001FASTVT: 5
TZST-0001SLOWVT: 5
TZST-0003FASTVT: 25 J
TZST-0003FASTVT: 35 J
TZST-0003FASTVT: 35 J
TZST-0003FASTVT: 35 J
TZST-0003SLOWVT: 25 J
TZST-0003SLOWVT: 35 J
VENTRICULAR PACING ICD: 0 pct
VF: 0

## 2013-02-05 NOTE — Progress Notes (Signed)
Single ICD check in clinic. Normal device function. 1 nst episode recorded lasting 13 beats. Battery voltage 2.69 V. Alert tones demonstrated and pt aware to call office if heard. ROV in 3 mths w/device clinic.

## 2013-02-10 ENCOUNTER — Telehealth: Payer: Self-pay | Admitting: Internal Medicine

## 2013-02-10 NOTE — Telephone Encounter (Signed)
Spoke with pt, aware of echo results. 

## 2013-02-10 NOTE — Telephone Encounter (Signed)
New Prob   Calling in following up on phone call she got earlier.

## 2013-04-30 ENCOUNTER — Ambulatory Visit (INDEPENDENT_AMBULATORY_CARE_PROVIDER_SITE_OTHER): Payer: 59 | Admitting: *Deleted

## 2013-04-30 DIAGNOSIS — I421 Obstructive hypertrophic cardiomyopathy: Secondary | ICD-10-CM

## 2013-04-30 LAB — ICD DEVICE OBSERVATION
DEV-0020ICD: NEGATIVE
PACEART VT: 0
RV LEAD IMPEDENCE ICD: 416 Ohm
RV LEAD THRESHOLD: 1 V
TZAT-0001SLOWVT: 1
TZAT-0001SLOWVT: 2
TZAT-0011SLOWVT: 10 ms
TZAT-0012FASTVT: 200 ms
TZAT-0013SLOWVT: 1
TZAT-0013SLOWVT: 2
TZAT-0018FASTVT: NEGATIVE
TZAT-0018SLOWVT: NEGATIVE
TZAT-0018SLOWVT: NEGATIVE
TZAT-0019FASTVT: 8 V
TZAT-0019SLOWVT: 8 V
TZAT-0019SLOWVT: 8 V
TZAT-0020FASTVT: 1.6 ms
TZON-0003FASTVT: 240 ms
TZON-0004SLOWVT: 32
TZON-0005SLOWVT: 12
TZON-0008FASTVT: 0 ms
TZON-0008SLOWVT: 0 ms
TZST-0001FASTVT: 4
TZST-0001FASTVT: 6
TZST-0001SLOWVT: 3
TZST-0001SLOWVT: 4
TZST-0001SLOWVT: 6
TZST-0003FASTVT: 25 J
TZST-0003FASTVT: 35 J
TZST-0003FASTVT: 35 J
TZST-0003SLOWVT: 35 J
TZST-0003SLOWVT: 35 J

## 2013-04-30 NOTE — Progress Notes (Signed)
ICD check in office. 

## 2013-05-26 ENCOUNTER — Encounter: Payer: Self-pay | Admitting: Internal Medicine

## 2013-05-31 ENCOUNTER — Other Ambulatory Visit: Payer: Self-pay | Admitting: Cardiology

## 2013-05-31 ENCOUNTER — Other Ambulatory Visit: Payer: Self-pay | Admitting: Internal Medicine

## 2013-05-31 DIAGNOSIS — Z95810 Presence of automatic (implantable) cardiac defibrillator: Secondary | ICD-10-CM

## 2013-05-31 DIAGNOSIS — I421 Obstructive hypertrophic cardiomyopathy: Secondary | ICD-10-CM

## 2013-05-31 MED ORDER — LOSARTAN POTASSIUM 100 MG PO TABS: 100.0000 mg | ORAL_TABLET | Freq: Every day | ORAL | Status: DC

## 2013-08-06 ENCOUNTER — Ambulatory Visit (INDEPENDENT_AMBULATORY_CARE_PROVIDER_SITE_OTHER): Payer: 59 | Admitting: *Deleted

## 2013-08-06 ENCOUNTER — Other Ambulatory Visit: Payer: Self-pay | Admitting: *Deleted

## 2013-08-06 DIAGNOSIS — Z95810 Presence of automatic (implantable) cardiac defibrillator: Secondary | ICD-10-CM

## 2013-08-06 DIAGNOSIS — I421 Obstructive hypertrophic cardiomyopathy: Secondary | ICD-10-CM

## 2013-08-06 MED ORDER — CARVEDILOL 25 MG PO TABS: 25.0000 mg | ORAL_TABLET | Freq: Two times a day (BID) | ORAL | Status: DC

## 2013-08-06 MED ORDER — ATORVASTATIN CALCIUM 20 MG PO TABS: 20.0000 mg | ORAL_TABLET | Freq: Every day | ORAL | Status: DC

## 2013-08-06 NOTE — Progress Notes (Signed)
icd check in clinic. Normal device function. Battery voltage 2.64 V. No episodes recorded. ROV in 3 mths w/SK.

## 2013-08-07 LAB — ICD DEVICE OBSERVATION
DEV-0020ICD: NEGATIVE
FVT: 0
PACEART VT: 0
RV LEAD AMPLITUDE: 10.2 mv
RV LEAD THRESHOLD: 1 V
TZAT-0005SLOWVT: 84 pct
TZAT-0005SLOWVT: 91 pct
TZAT-0011FASTVT: 10 ms
TZAT-0011SLOWVT: 10 ms
TZAT-0011SLOWVT: 10 ms
TZAT-0012FASTVT: 200 ms
TZAT-0012SLOWVT: 200 ms
TZAT-0012SLOWVT: 200 ms
TZAT-0013FASTVT: 1
TZAT-0013SLOWVT: 1
TZAT-0013SLOWVT: 2
TZAT-0018SLOWVT: NEGATIVE
TZAT-0018SLOWVT: NEGATIVE
TZAT-0019FASTVT: 8 V
TZAT-0020FASTVT: 1.6 ms
TZON-0003FASTVT: 240 ms
TZON-0004SLOWVT: 32
TZON-0005SLOWVT: 12
TZON-0008FASTVT: 0 ms
TZON-0008SLOWVT: 0 ms
TZST-0001FASTVT: 3
TZST-0001SLOWVT: 4
TZST-0001SLOWVT: 6
TZST-0003FASTVT: 25 J
TZST-0003FASTVT: 35 J
TZST-0003FASTVT: 35 J
TZST-0003SLOWVT: 35 J
TZST-0003SLOWVT: 35 J
VENTRICULAR PACING ICD: 0 pct

## 2013-09-01 ENCOUNTER — Encounter: Payer: Self-pay | Admitting: Internal Medicine

## 2013-10-27 ENCOUNTER — Encounter: Payer: Self-pay | Admitting: Internal Medicine

## 2013-10-27 ENCOUNTER — Ambulatory Visit (INDEPENDENT_AMBULATORY_CARE_PROVIDER_SITE_OTHER): Payer: 59 | Admitting: Internal Medicine

## 2013-10-27 VITALS — BP 135/89 | HR 65 | Ht <= 58 in | Wt 187.1 lb

## 2013-10-27 DIAGNOSIS — I472 Ventricular tachycardia: Secondary | ICD-10-CM

## 2013-10-27 DIAGNOSIS — R55 Syncope and collapse: Secondary | ICD-10-CM

## 2013-10-27 DIAGNOSIS — I421 Obstructive hypertrophic cardiomyopathy: Secondary | ICD-10-CM

## 2013-10-27 DIAGNOSIS — Z95810 Presence of automatic (implantable) cardiac defibrillator: Secondary | ICD-10-CM

## 2013-10-27 LAB — MDC_IDC_ENUM_SESS_TYPE_INCLINIC
HighPow Impedance: 41 Ohm
HighPow Impedance: 42 Ohm
HighPow Impedance: 42 Ohm
HighPow Impedance: 43 Ohm
HighPow Impedance: 43 Ohm
HighPow Impedance: 43 Ohm
HighPow Impedance: 45 Ohm
HighPow Impedance: 46 Ohm
HighPow Impedance: 46 Ohm
HighPow Impedance: 46 Ohm
HighPow Impedance: 47 Ohm
HighPow Impedance: 47 Ohm
HighPow Impedance: 47 Ohm
HighPow Impedance: 48 Ohm
HighPow Impedance: 50 Ohm
Lead Channel Impedance Value: 392 Ohm
Lead Channel Impedance Value: 392 Ohm
Lead Channel Impedance Value: 392 Ohm
Lead Channel Impedance Value: 400 Ohm
Lead Channel Impedance Value: 400 Ohm
Lead Channel Impedance Value: 408 Ohm
Lead Channel Pacing Threshold Amplitude: 1 V
Lead Channel Sensing Intrinsic Amplitude: 8.1 mV
Lead Channel Sensing Intrinsic Amplitude: 8.2 mV
Lead Channel Sensing Intrinsic Amplitude: 8.7 mV
Lead Channel Sensing Intrinsic Amplitude: 9.3 mV
Lead Channel Setting Pacing Amplitude: 2.5 V
Lead Channel Setting Pacing Pulse Width: 0.5 ms
Zone Setting Detection Interval: 300 ms

## 2013-10-27 NOTE — Progress Notes (Signed)
      Patient has no care team.   HPI  [name] is a 62 y.o. female Seen in followup for HCM with ICD implanatons  Feels great with good energy  Past Medical History  Diagnosis Date  . Ventricular tachycardia   . Mitral regurgitation     3,4 plus  . Cardiomyopathy, hypertrophic obstructive   . ICD (implantable cardiac defibrillator), single, in situ   . Syncope and collapse   . Hyperlipidemia, mixed   . Unspecified essential hypertension   . Anemia     Past Surgical History  Procedure Laterality Date  . Total abdominal hysterectomy    . Bilateral salpingoophorectomy    . Cardiac defibrillator placement  02/2009    Medtronic Maximo VR    Current Outpatient Prescriptions  Medication Sig Dispense Refill  . acetaminophen (TYLENOL) 500 MG tablet Take 500 mg by mouth every 6 (six) hours as needed.        Marland Kitchen aspirin 81 MG tablet Take 81 mg by mouth daily.        Marland Kitchen atorvastatin (LIPITOR) 20 MG tablet Take 1 tablet (20 mg total) by mouth daily.  90 tablet  0  . carvedilol (COREG) 25 MG tablet Take 1 tablet (25 mg total) by mouth 2 (two) times daily.  180 tablet  0  . hydrochlorothiazide (HYDRODIURIL) 25 MG tablet Take 1 tablet (25 mg total) by mouth daily.  90 tablet  3  . losartan (COZAAR) 100 MG tablet Take 1 tablet (100 mg total) by mouth daily.  90 tablet  2  . potassium chloride SA (K-DUR,KLOR-CON) 20 MEQ tablet Take 1 tablet (20 mEq total) by mouth daily.  90 tablet  0   No current facility-administered medications for this visit.    No Known Allergies  Review of Systems negative except from HPI and PMH  Physical Exam BP 135/89  Pulse 65  Ht 4\' 9"  (1.448 m)  Wt 187 lb 2 oz (84.879 kg)  BMI 40.48 kg/m2 Well developed and nourished in no acute distress HENT normal Neck supple with JVP-flat Clear Regular rate and rhythm, no murmurs or gallops Abd-soft with active BS No Clubbing cyanosis edema Skin-warm and dry A & Oriented  Grossly normal sensory and  motor function     Assessment and  Plan

## 2013-10-27 NOTE — Assessment & Plan Note (Signed)
No vt 

## 2013-10-27 NOTE — Assessment & Plan Note (Signed)
Stable   Need to review what has been done on risk stratifcication

## 2013-10-27 NOTE — Assessment & Plan Note (Signed)
No recurrent syncope 

## 2013-10-27 NOTE — Patient Instructions (Signed)
Your physician recommends that you continue on your current medications as directed. Please refer to the Current Medication list given to you today.  Remote monitoring is used to monitor your Pacemaker of ICD from home. This monitoring reduces the number of office visits required to check your device to one time per year. It allows us to keep an eye on the functioning of your device to ensure it is working properly. You are scheduled for a device check from home on 01/28/2014. You may send your transmission at any time that day. If you have a wireless device, the transmission will be sent automatically. After your physician reviews your transmission, you will receive a postcard with your next transmission date.  Your physician wants you to follow-up in: 1 year with Dr. Klein.  You will receive a reminder letter in the mail two months in advance. If you don't receive a letter, please call our office to schedule the follow-up appointment.    

## 2013-10-27 NOTE — Assessment & Plan Note (Signed)
The patient's device was interrogated.  The information was reviewed. No changes were made in the programming.    

## 2013-11-05 ENCOUNTER — Encounter: Payer: Self-pay | Admitting: Internal Medicine

## 2013-11-05 ENCOUNTER — Other Ambulatory Visit: Payer: Self-pay | Admitting: Internal Medicine

## 2013-11-22 ENCOUNTER — Telehealth: Payer: Self-pay | Admitting: *Deleted

## 2013-11-22 NOTE — Telephone Encounter (Signed)
Pt called in requesting letter to excuse her from jury duty. I advised her that we would not be able to give her a letter excusing her from duty (few years ago she had syncope issues and we wrote letter to excuse - but there has not been reoccurrence). Pt verbalized understanding.

## 2013-12-06 ENCOUNTER — Other Ambulatory Visit: Payer: Self-pay | Admitting: Internal Medicine

## 2014-01-09 ENCOUNTER — Other Ambulatory Visit: Payer: Self-pay | Admitting: Internal Medicine

## 2014-01-28 ENCOUNTER — Ambulatory Visit (INDEPENDENT_AMBULATORY_CARE_PROVIDER_SITE_OTHER): Payer: 59 | Admitting: *Deleted

## 2014-01-28 DIAGNOSIS — I421 Obstructive hypertrophic cardiomyopathy: Secondary | ICD-10-CM

## 2014-01-28 LAB — MDC_IDC_ENUM_SESS_TYPE_REMOTE
Battery Voltage: 2.64 V
Brady Statistic RV Percent Paced: 1 %
Lead Channel Sensing Intrinsic Amplitude: 9.2 mV
Lead Channel Setting Pacing Amplitude: 2.5 V
Lead Channel Setting Sensing Sensitivity: 0.3 mV
MDC IDC MSMT LEADCHNL RV IMPEDANCE VALUE: 400 Ohm
MDC IDC SET LEADCHNL RV PACING PULSEWIDTH: 0.5 ms
MDC IDC SET ZONE DETECTION INTERVAL: 240 ms
Zone Setting Detection Interval: 300 ms
Zone Setting Detection Interval: 350 ms

## 2014-02-08 NOTE — Progress Notes (Signed)
ICD remote 

## 2014-02-09 ENCOUNTER — Encounter: Payer: Self-pay | Admitting: *Deleted

## 2014-02-10 ENCOUNTER — Telehealth: Payer: Self-pay | Admitting: Internal Medicine

## 2014-02-10 NOTE — Telephone Encounter (Signed)
Pt would like a call today, left a message on my voicemail, did not say what the call is regarding, but needs a call before 2p at [phone]

## 2014-02-11 NOTE — Telephone Encounter (Signed)
Attempted to call, automated msg stated mailbox is full.

## 2014-02-14 ENCOUNTER — Ambulatory Visit (INDEPENDENT_AMBULATORY_CARE_PROVIDER_SITE_OTHER): Payer: 59 | Admitting: *Deleted

## 2014-02-14 DIAGNOSIS — Z4502 Encounter for adjustment and management of automatic implantable cardiac defibrillator: Secondary | ICD-10-CM

## 2014-02-14 NOTE — Telephone Encounter (Signed)
Pt states Belenda Cruise already confirmed transmission was received on 01/28/14.   Pt said her device beeped yesterday, I requested her to send a new manual transmission to confirm why the device alerted her; transmission received---alerted due to battery reaching ERI.  Pt will see Krisitin in [facility] 02/17/14 to turn off alert notifier. ROV w/ Dr. Graciela Husbands 03/16/14 @ 11:30 to discuss changeout.

## 2014-02-15 LAB — MDC_IDC_ENUM_SESS_TYPE_REMOTE
Battery Voltage: 2.62 V
Date Time Interrogation Session: 20150406123600
HighPow Impedance: 45 Ohm
Lead Channel Impedance Value: 392 Ohm
Lead Channel Setting Sensing Sensitivity: 0.3 mV
MDC IDC MSMT LEADCHNL RV SENSING INTR AMPL: 8.1 mV
MDC IDC SET LEADCHNL RV PACING AMPLITUDE: 2.5 V
MDC IDC SET LEADCHNL RV PACING PULSEWIDTH: 0.5 ms
MDC IDC SET ZONE DETECTION INTERVAL: 240 ms
MDC IDC STAT BRADY RV PERCENT PACED: 0 %
Zone Setting Detection Interval: 300 ms
Zone Setting Detection Interval: 350 ms

## 2014-02-17 ENCOUNTER — Ambulatory Visit (INDEPENDENT_AMBULATORY_CARE_PROVIDER_SITE_OTHER): Payer: 59 | Admitting: *Deleted

## 2014-02-17 DIAGNOSIS — I428 Other cardiomyopathies: Secondary | ICD-10-CM

## 2014-02-17 NOTE — Progress Notes (Signed)
Turned off device alert for battery. Pt scheduled 03-16-14 with SK.

## 2014-02-18 ENCOUNTER — Encounter: Payer: Self-pay | Admitting: Internal Medicine

## 2014-02-19 ENCOUNTER — Other Ambulatory Visit: Payer: Self-pay | Admitting: Internal Medicine

## 2014-02-23 ENCOUNTER — Encounter: Payer: Self-pay | Admitting: *Deleted

## 2014-03-08 ENCOUNTER — Encounter: Payer: Self-pay | Admitting: Internal Medicine

## 2014-03-16 ENCOUNTER — Ambulatory Visit (INDEPENDENT_AMBULATORY_CARE_PROVIDER_SITE_OTHER): Payer: 59 | Admitting: Internal Medicine

## 2014-03-16 ENCOUNTER — Encounter: Payer: Self-pay | Admitting: Internal Medicine

## 2014-03-16 ENCOUNTER — Encounter: Payer: Self-pay | Admitting: *Deleted

## 2014-03-16 VITALS — BP 134/84 | HR 69 | Ht <= 58 in | Wt 184.0 lb

## 2014-03-16 DIAGNOSIS — I4729 Other ventricular tachycardia: Secondary | ICD-10-CM

## 2014-03-16 DIAGNOSIS — I421 Obstructive hypertrophic cardiomyopathy: Secondary | ICD-10-CM

## 2014-03-16 DIAGNOSIS — Z01812 Encounter for preprocedural laboratory examination: Secondary | ICD-10-CM

## 2014-03-16 DIAGNOSIS — Z4502 Encounter for adjustment and management of automatic implantable cardiac defibrillator: Secondary | ICD-10-CM

## 2014-03-16 DIAGNOSIS — Z95810 Presence of automatic (implantable) cardiac defibrillator: Secondary | ICD-10-CM

## 2014-03-16 DIAGNOSIS — I472 Ventricular tachycardia: Secondary | ICD-10-CM

## 2014-03-16 NOTE — Patient Instructions (Signed)
Your physician recommends that you continue on your current medications as directed. Please refer to the Current Medication list given to you today.  Your physician recommends that you return for pre-procedure lab work on: 04/05/14  Your physician has recommended that you have a defibrillator generator (battery) change.  You are scheduled for: 04/11/2014     (Other available dates: 6/12, 6/16, 6/18)  Your wound check is scheduled for: 04/21/14 at 11:30 am

## 2014-03-16 NOTE — Progress Notes (Signed)
      Patient has no care team.   HPI  [name] is a 63 y.o. female Seen for ICD for secondary prevention  her device has reached ERI. It was implanted 2007 for syncope in the context of nonischemic valvular cardiomyopathy and pulmonary hypertension. At that time her ejection fraction of 40% with 3+ MR. There subsequently been improvement ventricular function so that 2014 it was measured at 55-60%  The patient denies chest pain, shortness of breath, nocturnal dyspnea, orthopnea or peripheral edema.  There have been no palpitations, lightheadedness or syncope.    Past Medical History  Diagnosis Date  . Ventricular tachycardia   . Mitral regurgitation     Moderate 2012/stable 2014  . Cardiomyopathy, hypertrophic obstructive     Reviewed SK 12/14>> I cant find the basis of this diagnosis  . ICD (implantable cardiac defibrillator), single, in situ   . Syncope and collapse   . Hyperlipidemia, mixed   . Unspecified essential hypertension   . Anemia     Past Surgical History  Procedure Laterality Date  . Total abdominal hysterectomy    . Bilateral salpingoophorectomy    . Cardiac defibrillator placement  02/2009    Medtronic Maximo VR    Current Outpatient Prescriptions  Medication Sig Dispense Refill  . acetaminophen (TYLENOL) 500 MG tablet Take 500 mg by mouth every 6 (six) hours as needed.        . aspirin 81 MG tablet Take 81 mg by mouth daily.        . atorvastatin (LIPITOR) 20 MG tablet TAKE 1 TABLET BY MOUTH ONCE DAILY  90 tablet  3  . carvedilol (COREG) 25 MG tablet TAKE 1 TABLET BY MOUTH TWICE DAILY  180 tablet  3  . hydrochlorothiazide (HYDRODIURIL) 25 MG tablet TAKE 1 TABLET BY MOUTH DAILY  90 tablet  1  . losartan (COZAAR) 100 MG tablet TAKE 1 TABLET BY MOUTH ONCE DAILY  90 tablet  0  . potassium chloride SA (K-DUR,KLOR-CON) 20 MEQ tablet TAKE 1 TABLET BY MOUTH EVERY DAY  90 tablet  0   No current facility-administered medications for this visit.    No  Known Allergies  Review of Systems negative except from HPI and PMH  Physical Exam BP 134/84  Pulse 69  Ht 4' 9" (1.448 m)  Wt 184 lb (83.462 kg)  BMI 39.81 kg/m2 Well developed and well nourished in no acute distress HENT normal E scleral and icterus clear Neck Supple JVP flat; carotids brisk and full Clear to ausculation Device pocket well healed; without hematoma or erythema.  There is no tethering Regular rate and rhythm, no murmurs gallops or rub Soft with active bowel sounds No clubbing cyanosis  Edema Alert and oriented, grossly normal motor and sensory function Skin Warm and Dry  ECG demonstrates sinus rhythm at 68 Intervals 20/08/39 Poor R-wave progression  Assessment and  Plan  Cardiomyopathy  Mitral regurgitation-moderate 3/14  Defibrillator-Medtronic at ERI  Overall she is doing well. Her device is at ERI. We have reviewed the benefits and risks of generator replacement.  These include but are not limited to lead fracture and infection.  The patient understands, agrees and is willing to proceed.      

## 2014-04-05 ENCOUNTER — Other Ambulatory Visit (INDEPENDENT_AMBULATORY_CARE_PROVIDER_SITE_OTHER): Payer: 59

## 2014-04-05 DIAGNOSIS — Z4502 Encounter for adjustment and management of automatic implantable cardiac defibrillator: Secondary | ICD-10-CM

## 2014-04-05 DIAGNOSIS — Z01812 Encounter for preprocedural laboratory examination: Secondary | ICD-10-CM

## 2014-04-05 DIAGNOSIS — Z95810 Presence of automatic (implantable) cardiac defibrillator: Secondary | ICD-10-CM

## 2014-04-05 DIAGNOSIS — I421 Obstructive hypertrophic cardiomyopathy: Secondary | ICD-10-CM

## 2014-04-05 LAB — CBC WITH DIFFERENTIAL/PLATELET
BASOS PCT: 0.4 % (ref 0.0–3.0)
Basophils Absolute: 0 10*3/uL (ref 0.0–0.1)
EOS PCT: 4 % (ref 0.0–5.0)
Eosinophils Absolute: 0.3 10*3/uL (ref 0.0–0.7)
HEMATOCRIT: 31.3 % — AB (ref 36.0–46.0)
Hemoglobin: 10.2 g/dL — ABNORMAL LOW (ref 12.0–15.0)
Lymphocytes Relative: 25.3 % (ref 12.0–46.0)
Lymphs Abs: 1.9 10*3/uL (ref 0.7–4.0)
MCHC: 32.7 g/dL (ref 30.0–36.0)
MCV: 72.7 fl — AB (ref 78.0–100.0)
MONO ABS: 0.6 10*3/uL (ref 0.1–1.0)
MONOS PCT: 7.3 % (ref 3.0–12.0)
Neutro Abs: 4.9 10*3/uL (ref 1.4–7.7)
Neutrophils Relative %: 63 % (ref 43.0–77.0)
PLATELETS: 200 10*3/uL (ref 150.0–400.0)
RBC: 4.3 Mil/uL (ref 3.87–5.11)
RDW: 17.8 % — ABNORMAL HIGH (ref 11.5–15.5)
WBC: 7.7 10*3/uL (ref 4.0–10.5)

## 2014-04-05 LAB — BASIC METABOLIC PANEL
BUN: 31 mg/dL — AB (ref 6–23)
CO2: 27 meq/L (ref 19–32)
Calcium: 9.3 mg/dL (ref 8.4–10.5)
Chloride: 106 mEq/L (ref 96–112)
Creatinine, Ser: 1.7 mg/dL — ABNORMAL HIGH (ref 0.4–1.2)
GFR: 39.41 mL/min — AB (ref >60.00)
GLUCOSE: 105 mg/dL — AB (ref 70–99)
Potassium: 3.6 mEq/L (ref 3.5–5.1)
SODIUM: 142 meq/L (ref 135–145)

## 2014-04-06 ENCOUNTER — Encounter (HOSPITAL_COMMUNITY): Payer: Self-pay | Admitting: Pharmacy Technician

## 2014-04-10 MED ORDER — CEFAZOLIN SODIUM-DEXTROSE 2-3 GM-% IV SOLR
2.0000 g | INTRAVENOUS | Status: DC
  Filled 2014-04-10: qty 50

## 2014-04-10 MED ORDER — GENTAMICIN SULFATE 40 MG/ML IJ SOLN
80.0000 mg | INTRAMUSCULAR | Status: DC
  Filled 2014-04-10: qty 2

## 2014-04-11 ENCOUNTER — Encounter (HOSPITAL_COMMUNITY)
Admission: RE | Disposition: A | Discharge status: Home / Self Care | Payer: Self-pay | Source: Ambulatory Visit | Attending: Internal Medicine

## 2014-04-11 ENCOUNTER — Ambulatory Visit (HOSPITAL_COMMUNITY)
Admission: RE | Admit: 2014-04-11 | Discharge: 2014-04-11 | Disposition: A | Discharge status: Home / Self Care | Payer: PRIVATE HEALTH INSURANCE | Source: Ambulatory Visit | Attending: Internal Medicine | Admitting: Internal Medicine

## 2014-04-11 DIAGNOSIS — I421 Obstructive hypertrophic cardiomyopathy: Secondary | ICD-10-CM

## 2014-04-11 DIAGNOSIS — Z7982 Long term (current) use of aspirin: Secondary | ICD-10-CM | POA: Insufficient documentation

## 2014-04-11 DIAGNOSIS — I422 Other hypertrophic cardiomyopathy: Secondary | ICD-10-CM | POA: Insufficient documentation

## 2014-04-11 DIAGNOSIS — I428 Other cardiomyopathies: Secondary | ICD-10-CM | POA: Insufficient documentation

## 2014-04-11 DIAGNOSIS — I2789 Other specified pulmonary heart diseases: Secondary | ICD-10-CM | POA: Insufficient documentation

## 2014-04-11 DIAGNOSIS — I4729 Other ventricular tachycardia: Secondary | ICD-10-CM

## 2014-04-11 DIAGNOSIS — Z4502 Encounter for adjustment and management of automatic implantable cardiac defibrillator: Secondary | ICD-10-CM

## 2014-04-11 DIAGNOSIS — Z95810 Presence of automatic (implantable) cardiac defibrillator: Secondary | ICD-10-CM

## 2014-04-11 DIAGNOSIS — I10 Essential (primary) hypertension: Secondary | ICD-10-CM | POA: Insufficient documentation

## 2014-04-11 DIAGNOSIS — D649 Anemia, unspecified: Secondary | ICD-10-CM | POA: Insufficient documentation

## 2014-04-11 DIAGNOSIS — I472 Ventricular tachycardia, unspecified: Secondary | ICD-10-CM | POA: Insufficient documentation

## 2014-04-11 DIAGNOSIS — I059 Rheumatic mitral valve disease, unspecified: Secondary | ICD-10-CM | POA: Insufficient documentation

## 2014-04-11 DIAGNOSIS — Z01812 Encounter for preprocedural laboratory examination: Secondary | ICD-10-CM

## 2014-04-11 DIAGNOSIS — E782 Mixed hyperlipidemia: Secondary | ICD-10-CM | POA: Insufficient documentation

## 2014-04-11 HISTORY — PX: IMPLANTABLE CARDIOVERTER DEFIBRILLATOR GENERATOR CHANGE: SHX5474

## 2014-04-11 LAB — SURGICAL PCR SCREEN
MRSA, PCR: NEGATIVE
STAPHYLOCOCCUS AUREUS: NEGATIVE

## 2014-04-11 SURGERY — IMPLANTABLE CARDIOVERTER DEFIBRILLATOR GENERATOR CHANGE
Anesthesia: LOCAL

## 2014-04-11 MED ORDER — SODIUM CHLORIDE 0.9 % IV SOLN: INTRAVENOUS | Status: AC

## 2014-04-11 MED ORDER — FENTANYL CITRATE 0.05 MG/ML IJ SOLN
INTRAMUSCULAR | Status: AC
  Filled 2014-04-11: qty 2

## 2014-04-11 MED ORDER — SODIUM CHLORIDE 0.9 % IV SOLN
INTRAVENOUS | Status: DC
  Administered 2014-04-11: 06:00:00 via INTRAVENOUS

## 2014-04-11 MED ORDER — MUPIROCIN 2 % EX OINT
TOPICAL_OINTMENT | Freq: Two times a day (BID) | CUTANEOUS | Status: DC
  Filled 2014-04-11: qty 22

## 2014-04-11 MED ORDER — LIDOCAINE HCL (PF) 1 % IJ SOLN
INTRAMUSCULAR | Status: AC
  Filled 2014-04-11: qty 30

## 2014-04-11 MED ORDER — MUPIROCIN 2 % EX OINT
TOPICAL_OINTMENT | CUTANEOUS | Status: AC
  Administered 2014-04-11: 1
  Filled 2014-04-11: qty 22

## 2014-04-11 MED ORDER — ACETAMINOPHEN 325 MG PO TABS
325.0000 mg | ORAL_TABLET | ORAL | Status: DC | PRN
  Filled 2014-04-11: qty 2

## 2014-04-11 MED ORDER — ONDANSETRON HCL 4 MG/2ML IJ SOLN: 4.0000 mg | Freq: Four times a day (QID) | INTRAMUSCULAR | Status: DC | PRN

## 2014-04-11 MED ORDER — CHLORHEXIDINE GLUCONATE 4 % EX LIQD
60.0000 mL | Freq: Once | CUTANEOUS | Status: DC
  Filled 2014-04-11: qty 60

## 2014-04-11 MED ORDER — MIDAZOLAM HCL 5 MG/5ML IJ SOLN
INTRAMUSCULAR | Status: AC
  Filled 2014-04-11: qty 5

## 2014-04-11 NOTE — Interval H&P Note (Signed)
ICD Criteria  Current LVEF:50% ;Obtained < 1 month ago.  NYHA Functional Classification: Class II  Heart Failure History:  Yes, Duration of heart failure since onset is > 9 months  Non-Ischemic Dilated Cardiomyopathy History:  No.  Atrial Fibrillation/Atrial Flutter:  No.  Ventricular Tachycardia History:  Yes, Hemodynamic instability present, VT Type is unknown.  Cardiac Arrest History:  No  History of Syndromes with Risk of Sudden Death:  Yes, hypertrophic cardiomyopathy  Previous ICD:  Yes, ICD Type:  Dual, Reason for ICD:  Secondary, reason for secondary prevention:  Syncope with documented  VT.  Electrophysiology Study: No.  Prior MI: No.  PPM: No.  OSA:  No  Patient Life Expectancy of >=1 year: Yes.  Anticoagulation Therapy:  Patient is NOT on anticoagulation therapy.   Beta Blocker Therapy:  Yes.   Ace Inhibitor/ARB Therapy:  Yes.History and Physical Interval Note:  04/11/2014 7:31 AM  [name]  has presented today for surgery, with the diagnosis of Battery Depletion  The various methods of treatment have been discussed with the patient and family. After consideration of risks, benefits and other options for treatment, the patient has consented to  Procedure(s): IMPLANTABLE CARDIOVERTER DEFIBRILLATOR GENERATOR CHANGE (N/A) as a surgical intervention .  The patient's history has been reviewed, patient examined, no change in status, stable for surgery.  I have reviewed the patient's chart and labs.  Questions were answered to the patient's satisfaction.     [name]

## 2014-04-11 NOTE — H&P (View-Only) (Signed)
      Patient has no care team.   HPI  [name] is a 63 y.o. female Seen for ICD for secondary prevention  her device has reached ERI. It was implanted 2007 for syncope in the context of nonischemic valvular cardiomyopathy and pulmonary hypertension. At that time her ejection fraction of 40% with 3+ MR. There subsequently been improvement ventricular function so that 2014 it was measured at 55-60%  The patient denies chest pain, shortness of breath, nocturnal dyspnea, orthopnea or peripheral edema.  There have been no palpitations, lightheadedness or syncope.    Past Medical History  Diagnosis Date  . Ventricular tachycardia   . Mitral regurgitation     Moderate 2012/stable 2014  . Cardiomyopathy, hypertrophic obstructive     Reviewed SK 12/14>> I cant find the basis of this diagnosis  . ICD (implantable cardiac defibrillator), single, in situ   . Syncope and collapse   . Hyperlipidemia, mixed   . Unspecified essential hypertension   . Anemia     Past Surgical History  Procedure Laterality Date  . Total abdominal hysterectomy    . Bilateral salpingoophorectomy    . Cardiac defibrillator placement  02/2009    Medtronic Maximo VR    Current Outpatient Prescriptions  Medication Sig Dispense Refill  . acetaminophen (TYLENOL) 500 MG tablet Take 500 mg by mouth every 6 (six) hours as needed.        Marland Kitchen aspirin 81 MG tablet Take 81 mg by mouth daily.        Marland Kitchen atorvastatin (LIPITOR) 20 MG tablet TAKE 1 TABLET BY MOUTH ONCE DAILY  90 tablet  3  . carvedilol (COREG) 25 MG tablet TAKE 1 TABLET BY MOUTH TWICE DAILY  180 tablet  3  . hydrochlorothiazide (HYDRODIURIL) 25 MG tablet TAKE 1 TABLET BY MOUTH DAILY  90 tablet  1  . losartan (COZAAR) 100 MG tablet TAKE 1 TABLET BY MOUTH ONCE DAILY  90 tablet  0  . potassium chloride SA (K-DUR,KLOR-CON) 20 MEQ tablet TAKE 1 TABLET BY MOUTH EVERY DAY  90 tablet  0   No current facility-administered medications for this visit.    No  Known Allergies  Review of Systems negative except from HPI and PMH  Physical Exam BP 134/84  Pulse 69  Ht 4\' 9"  (1.448 m)  Wt 184 lb (83.462 kg)  BMI 39.81 kg/m2 Well developed and well nourished in no acute distress HENT normal E scleral and icterus clear Neck Supple JVP flat; carotids brisk and full Clear to ausculation Device pocket well healed; without hematoma or erythema.  There is no tethering Regular rate and rhythm, no murmurs gallops or rub Soft with active bowel sounds No clubbing cyanosis  Edema Alert and oriented, grossly normal motor and sensory function Skin Warm and Dry  ECG demonstrates sinus rhythm at 68 Intervals 20/08/39 Poor R-wave progression  Assessment and  Plan  Cardiomyopathy  Mitral regurgitation-moderate 3/14  Defibrillator-Medtronic at ERI  Overall she is doing well. Her device is at [facility]. We have reviewed the benefits and risks of generator replacement.  These include but are not limited to lead fracture and infection.  The patient understands, agrees and is willing to proceed.

## 2014-04-11 NOTE — Discharge Instructions (Signed)
Device Battery Change, Care After   Refer to this sheet in the next few weeks. These instructions provide you with information on caring for yourself after your procedure. Your health care provider may also give you more specific instructions. Your treatment has been planned according to current medical practices, but problems sometimes occur. Call your health care provider if you have any problems or questions after your procedure. WHAT TO EXPECT AFTER THE PROCEDURE After your procedure, it is typical to have the following sensations:  Soreness at the incision site. HOME CARE INSTRUCTIONS   Keep the incision clean and dry. Remove outer dressing in the morning.  Dermabond will come off in 10-14 days.   No driving x 4 days.  Wound check in office on 04/21/14 at 1130.  Unless advised otherwise, you may shower beginning 48 hours after your procedure.  For the first week after the replacement, avoid stretching motions that pull at the incision site and avoid heavy exercise with the arm on the same side as the incision.  Only take over-the-counter or prescription medicines for pain, discomfort, or fever as directed by your health care provider. Your health care provider will tell you when you will need to next test your device by telephone or when to return to the office for follow up. SEEK MEDICAL CARE IF:   You have pain at the incision site that is not relieved by over-the-counter or prescription medicine.  There is drainage or pus from the incision site.  There is swelling larger than a lime at the incision site.  You develop red streaking that extends above or below the incision site.  You feel brief, intermittent palpitations, lightheadedness, or any symptoms that you feel might be related to your heart. SEEK IMMEDIATE MEDICAL CARE IF:   You experience chest pain that is different than the pain at the pacemaker site.  Shortness of breath.  Palpitations or irregular heart  beat.  Lightheadedness that does not go away quickly.  Fainting.  You have pain that gets worse and is not relieved by medicine. MAKE SURE YOU:   Understand these instructions.  Will watch your condition.  Will get help right away if you are not doing well or get worse. Document Released: 08/18/2013 Document Reviewed: 05/12/2013 [facility] Patient Information 2014 [place], [place].

## 2014-04-11 NOTE — CV Procedure (Signed)
Preoperative diagnosis  HCM VT prev ICD @ERI  Postoperative diagnosis same/   Procedure: Generator replacement     Following informed consent the patient was brought to the electrophysiology laboratory in place of the fluoroscopic table in the supine position after routine prep and drape lidocaine was infiltrated in the region of the previous incision and carried down to later the device pocket using sharp dissection and electrocautery. The pocket was opened the device was freed up and was explanted.  Interrogation of the previously implanted ICD ventricular lead St Jude [id number]  demonstrated an R wave of 10.1  millivolts., and impedance of 481 ohms, and a pacing threshold of 0.9 volts at 0.5 msec.  iMPEDANCE TYHROUGH HIGH VOLTAGE COILS 359/366  The lead was inspected. Repair was not needed. The lead was then attached to a Medtronic [id number] EVERA pulse generator, serial number [id number].    Through the device  the ICD ventricular lead demonstrated an R wave of 11.8  millivolts., and impedance of 399 ohms, and a pacing threshold of 0.5 volts at 0.4 msec    High voltage impedances were 49/47 ohms  The pocket was irrigated with antibiotic containing saline solution hemostasis was assured and the leads and the device were placed in the pocket. The wound was then closed in 2 layers in normal fashion.dERMABOND DRESSING was applied  The patient tolerated the procedure without apparent complication.  DFT testing was performed  Sherryl Manges   \

## 2014-04-12 ENCOUNTER — Other Ambulatory Visit: Payer: Self-pay | Admitting: Internal Medicine

## 2014-04-13 ENCOUNTER — Telehealth: Payer: Self-pay | Admitting: *Deleted

## 2014-04-13 NOTE — Telephone Encounter (Signed)
Pt received her adapter and will transmit tonight or tomorrow morning. Pt understands that if she sees a green check on her screen then she has successfully transmitted.

## 2014-04-21 ENCOUNTER — Ambulatory Visit (INDEPENDENT_AMBULATORY_CARE_PROVIDER_SITE_OTHER): Payer: 59 | Admitting: *Deleted

## 2014-04-21 DIAGNOSIS — I421 Obstructive hypertrophic cardiomyopathy: Secondary | ICD-10-CM

## 2014-04-21 LAB — MDC_IDC_ENUM_SESS_TYPE_INCLINIC
Battery Remaining Longevity: 138 mo
Battery Voltage: 3.16 V
Brady Statistic RV Percent Paced: 0.41 %
Date Time Interrogation Session: 20150611111650
HIGH POWER IMPEDANCE MEASURED VALUE: 41 Ohm
HIGH POWER IMPEDANCE MEASURED VALUE: 50 Ohm
HighPow Impedance: 190 Ohm
Lead Channel Impedance Value: 418 Ohm
Lead Channel Pacing Threshold Pulse Width: 0.4 ms
Lead Channel Sensing Intrinsic Amplitude: 9.125 mV
Lead Channel Sensing Intrinsic Amplitude: 9.375 mV
Lead Channel Setting Pacing Amplitude: 2.5 V
Lead Channel Setting Sensing Sensitivity: 0.3 mV
MDC IDC MSMT LEADCHNL RV PACING THRESHOLD AMPLITUDE: 0.75 V
MDC IDC SET LEADCHNL RV PACING PULSEWIDTH: 0.4 ms
MDC IDC SET ZONE DETECTION INTERVAL: 350 ms
Zone Setting Detection Interval: 300 ms
Zone Setting Detection Interval: 450 ms

## 2014-04-21 NOTE — Progress Notes (Signed)
Wound check appointment. Steri-strips removed. Wound without redness or edema. Incision edges approximated, wound well healed. Normal device function. Thresholds, sensing, and impedances consistent with implant measurements. Device programmed at 3.5V for extra safety margin until 3 month visit. Histogram distribution appropriate for patient and level of activity. No ventricular arrhythmias noted. Patient educated about wound care, arm mobility, lifting restrictions, shock plan. ROV in 3 months with the device clinic in [place].

## 2014-05-23 ENCOUNTER — Other Ambulatory Visit: Payer: Self-pay | Admitting: Internal Medicine

## 2014-05-25 ENCOUNTER — Encounter: Payer: Self-pay | Admitting: Internal Medicine

## 2014-05-30 ENCOUNTER — Telehealth: Payer: Self-pay | Admitting: Internal Medicine

## 2014-05-30 NOTE — Telephone Encounter (Signed)
Spoke with pt and ensured her that her monitor is working pt verbalized understanding.

## 2014-05-30 NOTE — Telephone Encounter (Signed)
New message     Patient has a question regarding their machine

## 2014-06-03 ENCOUNTER — Other Ambulatory Visit: Payer: Self-pay | Admitting: Internal Medicine

## 2014-07-09 ENCOUNTER — Other Ambulatory Visit: Payer: Self-pay | Admitting: Internal Medicine

## 2014-08-04 ENCOUNTER — Ambulatory Visit (INDEPENDENT_AMBULATORY_CARE_PROVIDER_SITE_OTHER): Payer: PRIVATE HEALTH INSURANCE | Admitting: *Deleted

## 2014-08-04 DIAGNOSIS — I4729 Other ventricular tachycardia: Secondary | ICD-10-CM

## 2014-08-04 DIAGNOSIS — I472 Ventricular tachycardia: Secondary | ICD-10-CM

## 2014-08-04 LAB — MDC_IDC_ENUM_SESS_TYPE_INCLINIC
Brady Statistic RV Percent Paced: 0.19 %
HIGH POWER IMPEDANCE MEASURED VALUE: 45 Ohm
HighPow Impedance: 190 Ohm
HighPow Impedance: 51 Ohm
Lead Channel Impedance Value: 418 Ohm
Lead Channel Pacing Threshold Amplitude: 0.75 V
Lead Channel Pacing Threshold Pulse Width: 0.4 ms
Lead Channel Sensing Intrinsic Amplitude: 10.875 mV
Lead Channel Setting Pacing Amplitude: 2.5 V
Lead Channel Setting Pacing Pulse Width: 0.4 ms
MDC IDC MSMT BATTERY REMAINING LONGEVITY: 137 mo
MDC IDC MSMT BATTERY VOLTAGE: 3.13 V
MDC IDC SESS DTM: 20150924135204
MDC IDC SET LEADCHNL RV SENSING SENSITIVITY: 0.3 mV
MDC IDC SET ZONE DETECTION INTERVAL: 450 ms
Zone Setting Detection Interval: 300 ms
Zone Setting Detection Interval: 350 ms

## 2014-08-04 NOTE — Progress Notes (Signed)
ICD check in clinic. Normal device function. 1 nst episode lasting 6 beats. Battery longevity 11.4 years. Changed RV output from 3.50 to 2.50 V. ROV 11-01-14 @ 1230 with SK in [place].

## 2014-08-09 ENCOUNTER — Telehealth: Payer: Self-pay | Admitting: Internal Medicine

## 2014-08-09 NOTE — Telephone Encounter (Signed)
Monitor display light is on like before. Recommended pt call Medtronic to troubleshoot why light is turned on and no transmission has been received since 07-22-14.

## 2014-08-09 NOTE — Telephone Encounter (Signed)
New message     Pt request [name] call her back

## 2014-08-15 ENCOUNTER — Encounter: Payer: Self-pay | Admitting: Internal Medicine

## 2014-10-10 ENCOUNTER — Other Ambulatory Visit: Payer: Self-pay | Admitting: Internal Medicine

## 2014-10-12 ENCOUNTER — Other Ambulatory Visit: Payer: Self-pay | Admitting: *Deleted

## 2014-10-20 ENCOUNTER — Encounter (HOSPITAL_COMMUNITY): Payer: Self-pay | Admitting: Internal Medicine

## 2014-11-01 ENCOUNTER — Ambulatory Visit (INDEPENDENT_AMBULATORY_CARE_PROVIDER_SITE_OTHER): Payer: PRIVATE HEALTH INSURANCE | Admitting: Internal Medicine

## 2014-11-01 ENCOUNTER — Encounter: Payer: Self-pay | Admitting: Internal Medicine

## 2014-11-01 VITALS — BP 114/68 | HR 68 | Ht <= 58 in | Wt 187.6 lb

## 2014-11-01 DIAGNOSIS — I428 Other cardiomyopathies: Secondary | ICD-10-CM

## 2014-11-01 DIAGNOSIS — I4729 Other ventricular tachycardia: Secondary | ICD-10-CM

## 2014-11-01 DIAGNOSIS — I472 Ventricular tachycardia: Secondary | ICD-10-CM

## 2014-11-01 DIAGNOSIS — Z4502 Encounter for adjustment and management of automatic implantable cardiac defibrillator: Secondary | ICD-10-CM

## 2014-11-01 DIAGNOSIS — R55 Syncope and collapse: Secondary | ICD-10-CM

## 2014-11-01 DIAGNOSIS — I429 Cardiomyopathy, unspecified: Secondary | ICD-10-CM

## 2014-11-01 LAB — MDC_IDC_ENUM_SESS_TYPE_INCLINIC
Brady Statistic RV Percent Paced: 0.14 %
Date Time Interrogation Session: 20151222132601
HIGH POWER IMPEDANCE MEASURED VALUE: 41 Ohm
HighPow Impedance: 190 Ohm
HighPow Impedance: 44 Ohm
Lead Channel Pacing Threshold Amplitude: 0.75 V
Lead Channel Pacing Threshold Pulse Width: 0.4 ms
Lead Channel Sensing Intrinsic Amplitude: 10.375 mV
Lead Channel Setting Pacing Amplitude: 2.5 V
Lead Channel Setting Pacing Pulse Width: 0.4 ms
MDC IDC MSMT BATTERY REMAINING LONGEVITY: 136 mo
MDC IDC MSMT BATTERY VOLTAGE: 3.09 V
MDC IDC MSMT LEADCHNL RV IMPEDANCE VALUE: 418 Ohm
MDC IDC SET LEADCHNL RV SENSING SENSITIVITY: 0.3 mV
MDC IDC SET ZONE DETECTION INTERVAL: 450 ms
Zone Setting Detection Interval: 300 ms
Zone Setting Detection Interval: 350 ms

## 2014-11-01 NOTE — Patient Instructions (Addendum)
Your physician recommends that you continue on your current medications as directed. Please refer to the Current Medication list given to you today.  Remote monitoring is used to monitor your Pacemaker of ICD from home. This monitoring reduces the number of office visits required to check your device to one time per year. It allows [place] to keep an eye on the functioning of your device to ensure it is working properly. You are scheduled for a device check from home on 01/31/15. You may send your transmission at any time that day. If you have a wireless device, the transmission will be sent automatically. After your physician reviews your transmission, you will receive a postcard with your next transmission date.  Your physician wants you to follow-up in: June 2016 with Dr. Graciela Husbands.  You will receive a reminder letter in the mail two months in advance. If you don't receive a letter, please call our office to schedule the follow-up appointment.  Your physician has requested that you have an echocardiogram - prior to June appointment with Dr. Graciela Husbands. Echocardiography is a painless test that uses sound waves to create images of your heart. It provides your doctor with information about the size and shape of your heart and how well your heart's chambers and valves are working. This procedure takes approximately one hour. There are no restrictions for this procedure.

## 2014-11-01 NOTE — Progress Notes (Signed)
      No care team member to display   HPI  [name] is a 63 y.o. female Seen for ICD for secondary prevention  her device  reached ERI. She underwent replacement 6/15  It was implanted 2007 for syncope in the context of nonischemic valvular cardiomyopathy and pulmonary hypertension. At that time her ejection fraction of 40% with 3+ MR. There subsequently been improvement ventricular function so that 2014 it was measured at 55-60%.  Moderate MR was present  The patient denies chest pain, shortness of breath, nocturnal dyspnea, orthopnea or peripheral edema.  There have been no palpitations, lightheadedness or syncope.    Past Medical History  Diagnosis Date  . Ventricular tachycardia   . Mitral regurgitation     Moderate 2012/stable 2014  . ICD (implantable cardiac defibrillator), single, in situ   . Syncope and collapse   . Hyperlipidemia, mixed   . Unspecified essential hypertension   . Anemia   . Nonischemic cardiomyopathy     EF 2007 35-40% (3+ MR)//2014 EF 55-60% (2+ MR)    Past Surgical History  Procedure Laterality Date  . Total abdominal hysterectomy    . Bilateral salpingoophorectomy    . Cardiac defibrillator placement  02/2009    Medtronic Maximo VR  . Implantable cardioverter defibrillator generator change N/A 04/11/2014    Procedure: IMPLANTABLE CARDIOVERTER DEFIBRILLATOR GENERATOR CHANGE;  Surgeon: Duke Salvia, MD;  Location: [facility] CATH LAB;  Service: Cardiovascular;  Laterality: N/A;    Current Outpatient Prescriptions  Medication Sig Dispense Refill  . acetaminophen (TYLENOL) 500 MG tablet Take 500 mg by mouth every 6 (six) hours as needed for moderate pain.     Marland Kitchen aspirin 81 MG tablet Take 81 mg by mouth daily.      Marland Kitchen atorvastatin (LIPITOR) 20 MG tablet Take 20 mg by mouth daily.    . carvedilol (COREG) 25 MG tablet Take 25 mg by mouth 2 (two) times daily with a meal.    . hydrochlorothiazide (HYDRODIURIL) 25 MG tablet Take 25 mg by mouth daily.      Marland Kitchen losartan (COZAAR) 100 MG tablet TAKE 1 TABLET BY MOUTH DAILY 90 tablet 2  . potassium chloride SA (K-DUR,KLOR-CON) 20 MEQ tablet Take 20 mEq by mouth daily.    . ranitidine (ZANTAC) 150 MG tablet Take 150 mg by mouth daily as needed for heartburn.     No current facility-administered medications for this visit.    No Known Allergies  Review of Systems negative except from HPI and PMH  Physical Exam BP 114/68 mmHg  Pulse 68  Ht 4\' 5"  (1.346 m)  Wt 187 lb 9.6 oz (85.095 kg)  BMI 46.97 kg/m2 Well developed and well nourished in no acute distress HENT normal E scleral and icterus clear Neck Supple JVP flat; carotids brisk and full Clear to ausculation Device pocket well healed; without hematoma or erythema.  There is no tethering Regular rate and rhythm, no murmurs gallops or rub Soft with active bowel sounds No clubbing cyanosis  Edema Alert and oriented, grossly normal motor and sensory function Skin Warm and Dry  ECG demonstrates sinus rhythm at 68 Intervals 20/08/39 Poor R-wave progression  Assessment and  Plan  Cardiomyopathy  Mitral regurgitation-moderate 3/14  Defibrillator-Medtronic   Congestive heart failure-mixed-chronic   We will anticipate echo next visit to followupp on cardiomyopathy and mitral regurgitation  Currently she is euvolemic and will continue her medications.

## 2014-11-03 ENCOUNTER — Encounter: Payer: PRIVATE HEALTH INSURANCE | Admitting: Internal Medicine

## 2014-11-03 ENCOUNTER — Other Ambulatory Visit: Payer: Self-pay | Admitting: Internal Medicine

## 2014-12-30 ENCOUNTER — Telehealth: Payer: Self-pay | Admitting: Internal Medicine

## 2014-12-30 ENCOUNTER — Other Ambulatory Visit: Payer: Self-pay | Admitting: *Deleted

## 2014-12-30 MED ORDER — LOSARTAN POTASSIUM 100 MG PO TABS: 100.0000 mg | ORAL_TABLET | Freq: Every day | ORAL | Status: DC

## 2014-12-30 MED ORDER — ASPIRIN 81 MG PO TABS: 81.0000 mg | ORAL_TABLET | Freq: Every day | ORAL | Status: AC

## 2014-12-30 MED ORDER — HYDROCHLOROTHIAZIDE 25 MG PO TABS: 25.0000 mg | ORAL_TABLET | Freq: Every day | ORAL | Status: AC

## 2014-12-30 MED ORDER — POTASSIUM CHLORIDE CRYS ER 20 MEQ PO TBCR: 20.0000 meq | EXTENDED_RELEASE_TABLET | Freq: Every day | ORAL | Status: AC

## 2014-12-30 MED ORDER — ATORVASTATIN CALCIUM 20 MG PO TABS
20.0000 mg | ORAL_TABLET | Freq: Every day | ORAL | Status: DC
Start: 2014-12-30 — End: 2015-02-03

## 2014-12-30 MED ORDER — CARVEDILOL 25 MG PO TABS: 25.0000 mg | ORAL_TABLET | Freq: Two times a day (BID) | ORAL | Status: DC

## 2014-12-30 NOTE — Telephone Encounter (Signed)
Spoke w/pt and answered all questions. Pt was transferred to [facility] to schedule echo/appointment with SK.

## 2014-12-30 NOTE — Telephone Encounter (Signed)
New message      Talk to Belenda Cruise in the device clinic

## 2015-01-31 ENCOUNTER — Ambulatory Visit (INDEPENDENT_AMBULATORY_CARE_PROVIDER_SITE_OTHER): Payer: Medicare Other | Admitting: *Deleted

## 2015-01-31 DIAGNOSIS — I428 Other cardiomyopathies: Secondary | ICD-10-CM

## 2015-01-31 DIAGNOSIS — I429 Cardiomyopathy, unspecified: Secondary | ICD-10-CM

## 2015-01-31 NOTE — Progress Notes (Signed)
Remote ICD transmission.   

## 2015-02-03 ENCOUNTER — Other Ambulatory Visit: Payer: Self-pay

## 2015-02-03 ENCOUNTER — Other Ambulatory Visit: Payer: Self-pay | Admitting: Internal Medicine

## 2015-02-03 LAB — MDC_IDC_ENUM_SESS_TYPE_REMOTE
Battery Remaining Longevity: 135 mo
Battery Voltage: 3.06 V
Brady Statistic RV Percent Paced: 0.11 %
HIGH POWER IMPEDANCE MEASURED VALUE: 52 Ohm
HighPow Impedance: 45 Ohm
Lead Channel Impedance Value: 456 Ohm
Lead Channel Pacing Threshold Pulse Width: 0.4 ms
Lead Channel Sensing Intrinsic Amplitude: 10.625 mV
Lead Channel Sensing Intrinsic Amplitude: 10.625 mV
Lead Channel Setting Pacing Amplitude: 2.5 V
Lead Channel Setting Pacing Pulse Width: 0.4 ms
MDC IDC MSMT LEADCHNL RV IMPEDANCE VALUE: 342 Ohm
MDC IDC MSMT LEADCHNL RV PACING THRESHOLD AMPLITUDE: 0.625 V
MDC IDC SESS DTM: 20160322130418
MDC IDC SET LEADCHNL RV SENSING SENSITIVITY: 0.3 mV
MDC IDC SET ZONE DETECTION INTERVAL: 450 ms
Zone Setting Detection Interval: 300 ms
Zone Setting Detection Interval: 350 ms

## 2015-02-03 MED ORDER — ATORVASTATIN CALCIUM 20 MG PO TABS: 20.0000 mg | ORAL_TABLET | Freq: Every day | ORAL | Status: AC

## 2015-02-03 MED ORDER — CARVEDILOL 25 MG PO TABS: 25.0000 mg | ORAL_TABLET | Freq: Two times a day (BID) | ORAL | Status: AC

## 2015-02-15 ENCOUNTER — Encounter: Payer: Self-pay | Admitting: Cardiology

## 2015-02-23 ENCOUNTER — Encounter: Payer: Self-pay | Admitting: Internal Medicine

## 2015-03-06 ENCOUNTER — Other Ambulatory Visit: Payer: Self-pay | Admitting: Internal Medicine

## 2015-03-06 DIAGNOSIS — I428 Other cardiomyopathies: Secondary | ICD-10-CM

## 2015-03-06 DIAGNOSIS — I255 Ischemic cardiomyopathy: Secondary | ICD-10-CM

## 2015-03-14 ENCOUNTER — Other Ambulatory Visit (HOSPITAL_COMMUNITY): Payer: Self-pay

## 2015-03-14 ENCOUNTER — Ambulatory Visit (HOSPITAL_COMMUNITY): Payer: Medicare Other | Attending: Internal Medicine

## 2015-03-14 DIAGNOSIS — I428 Other cardiomyopathies: Secondary | ICD-10-CM

## 2015-03-14 DIAGNOSIS — I10 Essential (primary) hypertension: Secondary | ICD-10-CM

## 2015-03-14 DIAGNOSIS — E785 Hyperlipidemia, unspecified: Secondary | ICD-10-CM | POA: Insufficient documentation

## 2015-03-14 DIAGNOSIS — I429 Cardiomyopathy, unspecified: Secondary | ICD-10-CM | POA: Diagnosis not present

## 2015-03-14 DIAGNOSIS — Z87891 Personal history of nicotine dependence: Secondary | ICD-10-CM | POA: Insufficient documentation

## 2015-03-14 NOTE — Progress Notes (Signed)
2D Echo completed. 03/14/2015 

## 2015-03-20 ENCOUNTER — Encounter: Payer: Self-pay | Admitting: Internal Medicine

## 2015-03-20 NOTE — Telephone Encounter (Signed)
New message    Patient calling returning call from Friday.     Echo 5/3.

## 2015-03-20 NOTE — Progress Notes (Signed)
I have reviwed echo with Dr Marsa Aris and suggestion was to repeat in 2 years

## 2015-03-22 ENCOUNTER — Other Ambulatory Visit: Payer: Self-pay | Admitting: *Deleted

## 2015-03-22 DIAGNOSIS — I428 Other cardiomyopathies: Secondary | ICD-10-CM

## 2015-03-22 NOTE — Telephone Encounter (Signed)
This encounter was created in error - please disregard.

## 2015-04-25 ENCOUNTER — Encounter: Payer: Self-pay | Admitting: Internal Medicine

## 2015-04-28 ENCOUNTER — Encounter: Payer: Self-pay | Admitting: Internal Medicine

## 2015-04-28 ENCOUNTER — Ambulatory Visit (INDEPENDENT_AMBULATORY_CARE_PROVIDER_SITE_OTHER): Payer: Medicare Other | Admitting: Internal Medicine

## 2015-04-28 VITALS — BP 122/76 | HR 67 | Ht <= 58 in | Wt 189.0 lb

## 2015-04-28 DIAGNOSIS — I421 Obstructive hypertrophic cardiomyopathy: Secondary | ICD-10-CM | POA: Diagnosis not present

## 2015-04-28 DIAGNOSIS — Z4502 Encounter for adjustment and management of automatic implantable cardiac defibrillator: Secondary | ICD-10-CM | POA: Diagnosis not present

## 2015-04-28 DIAGNOSIS — I428 Other cardiomyopathies: Secondary | ICD-10-CM

## 2015-04-28 DIAGNOSIS — I429 Cardiomyopathy, unspecified: Secondary | ICD-10-CM

## 2015-04-28 LAB — CUP PACEART INCLINIC DEVICE CHECK
Battery Remaining Longevity: 133 mo
Battery Voltage: 3.04 V
Brady Statistic RV Percent Paced: 0.08 %
Date Time Interrogation Session: 20160617171841
HIGH POWER IMPEDANCE MEASURED VALUE: 53 Ohm
HighPow Impedance: 190 Ohm
HighPow Impedance: 46 Ohm
Lead Channel Impedance Value: 456 Ohm
Lead Channel Setting Pacing Amplitude: 2.5 V
Lead Channel Setting Pacing Pulse Width: 0.4 ms
Lead Channel Setting Sensing Sensitivity: 0.3 mV
MDC IDC MSMT LEADCHNL RV PACING THRESHOLD AMPLITUDE: 0.625 V
MDC IDC MSMT LEADCHNL RV PACING THRESHOLD PULSEWIDTH: 0.4 ms
MDC IDC MSMT LEADCHNL RV SENSING INTR AMPL: 10.875 mV
MDC IDC MSMT LEADCHNL RV SENSING INTR AMPL: 11.125 mV
MDC IDC SET ZONE DETECTION INTERVAL: 300 ms
Zone Setting Detection Interval: 350 ms
Zone Setting Detection Interval: 450 ms

## 2015-04-28 NOTE — Progress Notes (Signed)
No care team member to display   HPI  [name] is a 64 y.o. female Seen for ICD for secondary prevention  her device  reached ERI. She underwent replacement 6/15  It was implanted 2007 for syncope in the context of nonischemic valvular cardiomyopathy and pulmonary hypertension. At that time her ejection fraction of 40% with 3+ MR. There subsequently been improvement ventricular function so that 2014 it was measured at 55-60%.  Moderate MR was present  The patient denies chest pain, shortness of breath, nocturnal dyspnea, orthopnea or peripheral edema.  There have been no palpitations, lightheadedness or syncope.    Past Medical History  Diagnosis Date  . Ventricular tachycardia   . Mitral regurgitation     Moderate 2012/stable 2014  . ICD (implantable cardiac defibrillator), single, in situ   . Syncope and collapse   . Hyperlipidemia, mixed   . Unspecified essential hypertension   . Anemia   . Nonischemic cardiomyopathy     EF 2007 35-40% (3+ MR)//2014 EF 55-60% (2+ MR)    Past Surgical History  Procedure Laterality Date  . Total abdominal hysterectomy    . Bilateral salpingoophorectomy    . Cardiac defibrillator placement  02/2009    Medtronic Maximo VR  . Implantable cardioverter defibrillator generator change N/A 04/11/2014    Procedure: IMPLANTABLE CARDIOVERTER DEFIBRILLATOR GENERATOR CHANGE;  Surgeon: Duke Salvia, MD;  Location: [facility] CATH LAB;  Service: Cardiovascular;  Laterality: N/A;    Current Outpatient Prescriptions  Medication Sig Dispense Refill  . acetaminophen (TYLENOL) 500 MG tablet Take 500 mg by mouth every 6 (six) hours as needed for moderate pain.     Marland Kitchen aspirin 81 MG tablet Take 1 tablet (81 mg total) by mouth daily. 30 tablet 6  . atorvastatin (LIPITOR) 20 MG tablet Take 1 tablet (20 mg total) by mouth daily. 90 tablet 1  . carvedilol (COREG) 25 MG tablet Take 1 tablet (25 mg total) by mouth 2 (two) times daily. 180 tablet 1  .  hydrochlorothiazide (HYDRODIURIL) 25 MG tablet Take 1 tablet (25 mg total) by mouth daily. 30 tablet 6  . losartan (COZAAR) 100 MG tablet Take 1 tablet (100 mg total) by mouth daily. 30 tablet 6  . potassium chloride SA (K-DUR,KLOR-CON) 20 MEQ tablet Take 1 tablet (20 mEq total) by mouth daily. 30 tablet 6  . ranitidine (ZANTAC) 150 MG tablet Take 150 mg by mouth daily as needed for heartburn.    . ASPIRIN LOW DOSE 81 MG EC tablet Take one tab by mouth once a day  6   No current facility-administered medications for this visit.    No Known Allergies  Review of Systems negative except from HPI and PMH  Physical Exam BP 122/76 mmHg  Pulse 67  Ht  (1.448 m)  Wt 189 lb (85.73 kg)  BMI 40.89 kg/m2  SpO2 99% Well developed and well nourished in no acute distress HENT normal E scleral and icterus clear Neck Supple JVP flat; carotids brisk and full Clear to ausculation Device pocket well healed; without hematoma or erythema.  There is no tethering Regular rate and rhythm, no murmurs gallops or rub Soft with active bowel sounds No clubbing cyanosis  Edema Alert and oriented, grossly normal motor and sensory function Skin Warm and Dry  ECG demonstrates sinus rhythm at 68 Intervals 20/08/39 Poor R-wave progression  Assessment and  Plan  Cardiomyopathy  Mitral regurgitation-moderate 3/14  Defibrillator-Medtronic   Congestive heart failure-mixed-chronic  Will plan recheck of echo in 18 months   Currently she is euvolemic and will continue her medications.  Check her BMET

## 2015-04-28 NOTE — Patient Instructions (Addendum)
Medication Instructions:  Your physician recommends that you continue on your current medications as directed. Please refer to the Current Medication list given to you today.  Labwork: Your physician recommends that you return for lab work    Testing/Procedures: NONE  Follow-Up: Remote monitoring is used to monitor your Pacemaker or ICD from home. This monitoring reduces the number of office visits required to check your device to one time per year. It allows [place] to keep an eye on the functioning of your device to ensure it is working properly. You are scheduled for a device check from home on 07/31/2015. You may send your transmission at any time that day. If you have a wireless device, the transmission will be sent automatically. After your physician reviews your transmission, you will receive a postcard with your next transmission date.  Your physician wants you to follow-up in: 12 months with Dr. Graciela Husbands. You will receive a reminder letter in the mail two months in advance. If you don't receive a letter, please call our office to schedule the follow-up appointment.    Any Other Special Instructions Will Be Listed Below (If Applicable).

## 2015-05-01 ENCOUNTER — Telehealth: Payer: Self-pay | Admitting: Internal Medicine

## 2015-05-01 NOTE — Telephone Encounter (Signed)
New message      Pt is due to have labs drawn in [place].  She want to know if she should be fasting.

## 2015-05-01 NOTE — Telephone Encounter (Signed)
Informed patient she did not need to be fasting for blood work today. She verbalized understanding.

## 2015-05-02 ENCOUNTER — Encounter: Payer: Self-pay | Admitting: Internal Medicine

## 2015-05-23 ENCOUNTER — Telehealth: Payer: Self-pay | Admitting: Internal Medicine

## 2015-05-23 MED ORDER — LOSARTAN POTASSIUM 100 MG PO TABS: 100.0000 mg | ORAL_TABLET | Freq: Every day | ORAL | Status: AC

## 2015-05-23 NOTE — Telephone Encounter (Signed)
90 rx sent to requested pharmacy

## 2015-05-23 NOTE — Telephone Encounter (Signed)
New Message  Pt requesting 90 day [place] RX. Pharmacy is calling about new prescription to mirror change. Please call back and discuss.

## 2015-07-18 ENCOUNTER — Telehealth: Payer: Self-pay | Admitting: Internal Medicine

## 2015-07-31 ENCOUNTER — Telehealth: Payer: Self-pay | Admitting: Cardiology

## 2015-07-31 ENCOUNTER — Encounter: Payer: Medicare Other | Admitting: *Deleted

## 2015-07-31 NOTE — Telephone Encounter (Signed)
Spoke w/ pt son and he informed me that pt had passed away on 08-10-15.

## 2015-08-12 NOTE — Telephone Encounter (Signed)
New Message        Pt's daughter calling wanting to make Dr. Graciela Husbands aware that pt passed away this morning.

## 2015-08-12 DEATH — deceased
